# Patient Record
Sex: Female | Born: 1985 | Race: Black or African American | Hispanic: No | Marital: Single | Smoking: Never smoker
Health system: Southern US, Community
[De-identification: ages and names within clinical notes are randomized; demographics above are authoritative.]

## PROBLEM LIST (undated history)

## (undated) ENCOUNTER — Inpatient Hospital Stay (HOSPITAL_COMMUNITY): Payer: Self-pay

## (undated) DIAGNOSIS — IMO0002 Reserved for concepts with insufficient information to code with codable children: Secondary | ICD-10-CM

## (undated) DIAGNOSIS — D219 Benign neoplasm of connective and other soft tissue, unspecified: Secondary | ICD-10-CM

## (undated) HISTORY — DX: Reserved for concepts with insufficient information to code with codable children: IMO0002

## (undated) SURGERY — Surgical Case
Anesthesia: *Unknown

---
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
## 2005-09-06 ENCOUNTER — Inpatient Hospital Stay (HOSPITAL_COMMUNITY): Admission: AD | Admit: 2005-09-06 | Discharge: 2005-09-06 | Payer: Self-pay | Admitting: Obstetrics & Gynecology

## 2005-09-07 ENCOUNTER — Inpatient Hospital Stay (HOSPITAL_COMMUNITY): Admission: AD | Admit: 2005-09-07 | Discharge: 2005-09-07 | Payer: Self-pay | Admitting: Obstetrics & Gynecology

## 2005-09-08 ENCOUNTER — Inpatient Hospital Stay (HOSPITAL_COMMUNITY): Admission: AD | Admit: 2005-09-08 | Discharge: 2005-09-08 | Payer: Self-pay | Admitting: Obstetrics and Gynecology

## 2010-03-31 ENCOUNTER — Emergency Department (HOSPITAL_COMMUNITY)
Admission: EM | Admit: 2010-03-31 | Discharge: 2010-03-31 | Payer: Self-pay | Source: Home / Self Care | Admitting: Emergency Medicine

## 2010-09-23 ENCOUNTER — Inpatient Hospital Stay (INDEPENDENT_AMBULATORY_CARE_PROVIDER_SITE_OTHER)
Admission: RE | Admit: 2010-09-23 | Discharge: 2010-09-23 | Disposition: A | Discharge status: Home / Self Care | Payer: PRIVATE HEALTH INSURANCE | Source: Ambulatory Visit | Attending: Emergency Medicine | Admitting: Emergency Medicine

## 2010-09-23 DIAGNOSIS — J309 Allergic rhinitis, unspecified: Secondary | ICD-10-CM

## 2010-10-04 ENCOUNTER — Inpatient Hospital Stay (INDEPENDENT_AMBULATORY_CARE_PROVIDER_SITE_OTHER)
Admission: RE | Admit: 2010-10-04 | Discharge: 2010-10-04 | Disposition: A | Discharge status: Home / Self Care | Payer: PRIVATE HEALTH INSURANCE | Source: Ambulatory Visit | Attending: Emergency Medicine | Admitting: Emergency Medicine

## 2010-10-04 DIAGNOSIS — Z331 Pregnant state, incidental: Secondary | ICD-10-CM

## 2010-10-14 ENCOUNTER — Inpatient Hospital Stay (HOSPITAL_COMMUNITY)
Admission: EM | Admit: 2010-10-14 | Discharge: 2010-10-14 | Disposition: A | Discharge status: Home / Self Care | Payer: PRIVATE HEALTH INSURANCE | Source: Ambulatory Visit | Attending: Obstetrics & Gynecology | Admitting: Obstetrics & Gynecology

## 2010-10-14 ENCOUNTER — Inpatient Hospital Stay (HOSPITAL_COMMUNITY): Payer: PRIVATE HEALTH INSURANCE

## 2010-10-14 DIAGNOSIS — O209 Hemorrhage in early pregnancy, unspecified: Secondary | ICD-10-CM

## 2010-10-14 LAB — WET PREP, GENITAL: Trich, Wet Prep: NONE SEEN

## 2010-10-14 LAB — URINE MICROSCOPIC-ADD ON

## 2010-10-14 LAB — CBC
Hemoglobin: 11.9 g/dL — ABNORMAL LOW (ref 12.0–15.0)
MCHC: 32.5 g/dL (ref 30.0–36.0)
Platelets: 244 10*3/uL (ref 150–400)

## 2010-10-14 LAB — URINALYSIS, ROUTINE W REFLEX MICROSCOPIC
Bilirubin Urine: NEGATIVE
Glucose, UA: NEGATIVE mg/dL
Ketones, ur: NEGATIVE mg/dL
Nitrite: NEGATIVE
Protein, ur: NEGATIVE mg/dL
Urobilinogen, UA: 0.2 mg/dL (ref 0.0–1.0)

## 2010-10-14 LAB — HCG, QUANTITATIVE, PREGNANCY: hCG, Beta Chain, Quant, S: 5471 m[IU]/mL — ABNORMAL HIGH (ref <5)

## 2010-10-14 LAB — ABO/RH: ABO/RH(D): A POS

## 2010-10-16 ENCOUNTER — Inpatient Hospital Stay (HOSPITAL_COMMUNITY)
Admission: AD | Admit: 2010-10-16 | Discharge: 2010-10-16 | Disposition: A | Discharge status: Home / Self Care | Payer: PRIVATE HEALTH INSURANCE | Source: Ambulatory Visit | Attending: Obstetrics & Gynecology | Admitting: Obstetrics & Gynecology

## 2010-10-16 ENCOUNTER — Inpatient Hospital Stay (HOSPITAL_COMMUNITY): Payer: PRIVATE HEALTH INSURANCE

## 2010-10-16 DIAGNOSIS — O99891 Other specified diseases and conditions complicating pregnancy: Secondary | ICD-10-CM | POA: Insufficient documentation

## 2010-10-16 DIAGNOSIS — O9989 Other specified diseases and conditions complicating pregnancy, childbirth and the puerperium: Secondary | ICD-10-CM

## 2010-10-16 LAB — HCG, QUANTITATIVE, PREGNANCY: hCG, Beta Chain, Quant, S: 8944 m[IU]/mL — ABNORMAL HIGH (ref <5)

## 2010-10-17 ENCOUNTER — Other Ambulatory Visit: Payer: Self-pay | Admitting: Obstetrics & Gynecology

## 2010-10-17 DIAGNOSIS — O3680X0 Pregnancy with inconclusive fetal viability, not applicable or unspecified: Secondary | ICD-10-CM

## 2010-10-17 LAB — GC/CHLAMYDIA PROBE AMP, GENITAL: GC Probe Amp, Genital: NEGATIVE

## 2010-10-20 ENCOUNTER — Ambulatory Visit (HOSPITAL_COMMUNITY): Payer: PRIVATE HEALTH INSURANCE

## 2010-11-17 ENCOUNTER — Other Ambulatory Visit: Payer: Self-pay | Admitting: Obstetrics and Gynecology

## 2010-11-19 ENCOUNTER — Inpatient Hospital Stay (HOSPITAL_COMMUNITY)
Admission: AD | Admit: 2010-11-19 | Discharge: 2010-11-19 | Disposition: A | Discharge status: Home / Self Care | Payer: PRIVATE HEALTH INSURANCE | Source: Ambulatory Visit | Attending: Obstetrics and Gynecology | Admitting: Obstetrics and Gynecology

## 2010-11-19 DIAGNOSIS — Z79899 Other long term (current) drug therapy: Secondary | ICD-10-CM

## 2010-11-19 DIAGNOSIS — O039 Complete or unspecified spontaneous abortion without complication: Secondary | ICD-10-CM | POA: Insufficient documentation

## 2011-09-29 ENCOUNTER — Emergency Department (HOSPITAL_COMMUNITY)
Admission: EM | Admit: 2011-09-29 | Discharge: 2011-09-29 | Disposition: A | Discharge status: Home / Self Care | Payer: PRIVATE HEALTH INSURANCE | Attending: Emergency Medicine | Admitting: Emergency Medicine

## 2011-09-29 ENCOUNTER — Encounter (HOSPITAL_COMMUNITY): Payer: Self-pay | Admitting: Emergency Medicine

## 2011-09-29 ENCOUNTER — Emergency Department (HOSPITAL_COMMUNITY): Payer: PRIVATE HEALTH INSURANCE

## 2011-09-29 DIAGNOSIS — J45909 Unspecified asthma, uncomplicated: Secondary | ICD-10-CM | POA: Insufficient documentation

## 2011-09-29 DIAGNOSIS — W230XXA Caught, crushed, jammed, or pinched between moving objects, initial encounter: Secondary | ICD-10-CM | POA: Insufficient documentation

## 2011-09-29 DIAGNOSIS — S6000XA Contusion of unspecified finger without damage to nail, initial encounter: Secondary | ICD-10-CM

## 2011-09-29 DIAGNOSIS — S61219A Laceration without foreign body of unspecified finger without damage to nail, initial encounter: Secondary | ICD-10-CM

## 2011-09-29 DIAGNOSIS — IMO0002 Reserved for concepts with insufficient information to code with codable children: Secondary | ICD-10-CM | POA: Insufficient documentation

## 2011-09-29 DIAGNOSIS — S61209A Unspecified open wound of unspecified finger without damage to nail, initial encounter: Secondary | ICD-10-CM | POA: Insufficient documentation

## 2011-09-29 MED ORDER — HYDROCODONE-ACETAMINOPHEN 5-325 MG PO TABS: 1.0000 | ORAL_TABLET | ORAL | Status: AC | PRN

## 2011-09-29 MED ORDER — OXYCODONE-ACETAMINOPHEN 5-325 MG PO TABS
1.0000 | ORAL_TABLET | Freq: Once | ORAL | Status: AC
  Administered 2011-09-29: 1 via ORAL
  Filled 2011-09-29: qty 1

## 2011-09-29 MED ORDER — IBUPROFEN 800 MG PO TABS
800.0000 mg | ORAL_TABLET | Freq: Once | ORAL | Status: AC
  Administered 2011-09-29: 800 mg via ORAL
  Filled 2011-09-29: qty 1

## 2011-09-29 MED ORDER — TETANUS-DIPHTH-ACELL PERTUSSIS 5-2.5-18.5 LF-MCG/0.5 IM SUSP
0.5000 mL | Freq: Once | INTRAMUSCULAR | Status: AC
  Administered 2011-09-29: 0.5 mL via INTRAMUSCULAR
  Filled 2011-09-29: qty 0.5

## 2011-09-29 NOTE — Discharge Instructions (Signed)
Contusion A contusion is a deep bruise. Contusions happen when an injury causes bleeding under the skin. Signs of bruising include pain, puffiness (swelling), and discolored skin. The contusion may turn blue, purple, or yellow. HOME CARE   Put ice on the injured area.   Put ice in a plastic bag.   Place a towel between your skin and the bag.   Leave the ice on for 15 to 20 minutes, 3 to 4 times a day.   Only take medicine as told by your doctor.   Rest the injured area.   If possible, raise (elevate) the injured area to lessen puffiness.  GET HELP RIGHT AWAY IF:   You have more bruising or puffiness.   You have pain that is getting worse.   Your puffiness or pain is not helped by medicine.  MAKE SURE YOU:   Understand these instructions.   Will watch your condition.   Will get help right away if you are not doing well or get worse.  Document Released: 10/31/2007 Document Revised: 05/03/2011 Document Reviewed: 03/19/2011 [facility] Patient Information 2012 [place], [place]. The bruising and swelling is normal.  After an injury like yours as discussed please return if the finger turns numb discolored cold as also discussed keeping her hand elevated for the next 24 hours as much as possible and use ice to the palmar surface to help decrease swelling

## 2011-09-29 NOTE — ED Notes (Signed)
Patient states that she was seen here earlier today for finger laceration (middle right finger) and has sutures placed; patient states that her finger started turning blue earlier this afternoon, so she came back to have it evaluated.  Patient reports pain and numbness in finger; patient able to move all digits on right hand.  Two small blue/black areas noted to anterior joint areas of right middle finger.

## 2011-09-29 NOTE — ED Provider Notes (Signed)
History     CSN: [id number]  Arrival date & time 09/29/11  1942   First MD Initiated Contact with Patient 09/29/11 2007      Chief Complaint  Patient presents with  . Finger Injury    (Consider location/radiation/quality/duration/timing/severity/associated sxs/prior treatment) HPI Comments: Earlier today for finger laceration after being closed in a door.  She is now concerned because of the bruising  The history is provided by the patient.    Past Medical History  Diagnosis Date  . Asthma     History reviewed. No pertinent past surgical history.  History reviewed. No pertinent family history.  History  Substance Use Topics  . Smoking status: Never Smoker   . Smokeless tobacco: Not on file  . Alcohol Use: Yes     Social Use    OB History    Grav Para Term Preterm Abortions TAB SAB Ect Mult Living                  Review of Systems  Constitutional: Negative for fever.  Musculoskeletal: Positive for joint swelling.  Skin: Positive for wound.    Allergies  Review of patient's allergies indicates no known allergies.  Home Medications   Current Outpatient Rx  Name Route Sig Dispense Refill  . HYDROCODONE-ACETAMINOPHEN 5-325 MG PO TABS Oral Take 1 tablet by mouth every 4 (four) hours as needed for pain. 10 tablet 0  . NAPROXEN SODIUM 220 MG PO TABS Oral Take 440 mg by mouth as needed. For headache      BP 135/76  Pulse 96  Temp(Src) 98.1 F (36.7 C) (Oral)  Resp 20  SpO2 99%  LMP 09/05/2011  Physical Exam  Constitutional: She appears well-developed and well-nourished.  HENT:  Head: Normocephalic.  Neck: Normal range of motion.  Cardiovascular: Normal rate.   Pulmonary/Chest: Effort normal.  Musculoskeletal: Normal range of motion.  Neurological: She is alert.  Skin: Skin is warm.       Slight bruising and ecchymotic areas over the PIP knuckle of the right middle finger.  Full range of motion cap refill less than 3 seconds    ED Course    Procedures (including critical care time)  Labs Reviewed - No data to display Dg Finger Middle Right  09/29/2011  *RADIOLOGY REPORT*  Clinical Data: Closed right long finger in a door.  Laceration. Swelling.  RIGHT MIDDLE FINGER 2+V 09/29/2011:  Comparison: None.  Findings: Soft tissue injury dorsally overlying the head of the middle phalanx. No evidence of acute or subacute fracture or dislocation.  Well-preserved joint spaces.  Well-preserved bone mineral density.  No intrinsic osseous abnormalities.    No opaque foreign bodies in the soft tissues.  IMPRESSION: Soft tissue injury.  No osseous abnormality.  Original Report Authenticated By: Arnell Sieving, M.D.     1. Contusion of finger, right       MDM  Reassured patient that the bruising and swelling, was normal.  She feels reassured with this dressing was reapplied.  Patient was discharged him with the understanding that she should keep the finger elevated and iced for the next 24-48 hours        Arman Filter, NP 09/29/11 2033

## 2011-09-29 NOTE — ED Provider Notes (Signed)
Medical screening examination/treatment/procedure(s) were performed by non-physician practitioner and as supervising physician I was immediately available for consultation/collaboration.   Janeva Peaster R. Tashona Calk, MD 09/29/11 2338 

## 2011-09-29 NOTE — Discharge Instructions (Signed)
Please have your laceration be evaluated in approximately one week.  Please have it reevaluated sooner if you have redness, worsening swelling, fevers or pus drainage from the wound.  You may placed antibiotic ointment over the laceration and a dressing to cover it.  You may gently wash with soap and water but otherwise keep it dry.  Contusion A contusion is a deep bruise. Contusions are the result of an injury that caused bleeding under the skin. The contusion may turn blue, purple, or yellow. Minor injuries will give you a painless contusion, but more severe contusions may stay painful and swollen for a few weeks.  CAUSES  A contusion is usually caused by a blow, trauma, or direct force to an area of the body. SYMPTOMS   Swelling and redness of the injured area.   Bruising of the injured area.   Tenderness and soreness of the injured area.   Pain.  DIAGNOSIS  The diagnosis can be made by taking a history and physical exam. An X-ray, CT scan, or MRI may be needed to determine if there were any associated injuries, such as fractures. TREATMENT  Specific treatment will depend on what area of the body was injured. In general, the best treatment for a contusion is resting, icing, elevating, and applying cold compresses to the injured area. Over-the-counter medicines may also be recommended for pain control. Ask your caregiver what the best treatment is for your contusion. HOME CARE INSTRUCTIONS   Put ice on the injured area.   Put ice in a plastic bag.   Place a towel between your skin and the bag.   Leave the ice on for 15 to 20 minutes, 3 to 4 times a day.   Only take over-the-counter or prescription medicines for pain, discomfort, or fever as directed by your caregiver. Your caregiver may recommend avoiding anti-inflammatory medicines (aspirin, ibuprofen, and naproxen) for 48 hours because these medicines may increase bruising.   Rest the injured area.   If possible, elevate the  injured area to reduce swelling.  SEEK IMMEDIATE MEDICAL CARE IF:   You have increased bruising or swelling.   You have pain that is getting worse.   Your swelling or pain is not relieved with medicines.  MAKE SURE YOU:   Understand these instructions.   Will watch your condition.   Will get help right away if you are not doing well or get worse.  Document Released: 02/21/2005 Document Revised: 05/03/2011 Document Reviewed: 03/19/2011 [facility] Patient Information 2012 [place], [place].Laceration Care, Adult A laceration is a cut or lesion that goes through all layers of the skin and into the tissue just beneath the skin. TREATMENT  Some lacerations may not require closure. Some lacerations may not be able to be closed due to an increased risk of infection. It is important to see your caregiver as soon as possible after an injury to minimize the risk of infection and maximize the opportunity for successful closure. If closure is appropriate, pain medicines may be given, if needed. The wound will be cleaned to help prevent infection. Your caregiver will use stitches (sutures), staples, wound glue (adhesive), or skin adhesive strips to repair the laceration. These tools bring the skin edges together to allow for faster healing and a better cosmetic outcome. However, all wounds will heal with a scar. Once the wound has healed, scarring can be minimized by covering the wound with sunscreen during the day for 1 full year. HOME CARE INSTRUCTIONS  For sutures or staples:  Keep the wound clean and dry.   If you were given a bandage (dressing), you should change it at least once a day. Also, change the dressing if it becomes wet or dirty, or as directed by your caregiver.   Wash the wound with soap and water 2 times a day. Rinse the wound off with water to remove all soap. Pat the wound dry with a clean towel.   After cleaning, apply a thin layer of the antibiotic ointment as recommended by your  caregiver. This will help prevent infection and keep the dressing from sticking.   You may shower as usual after the first 24 hours. Do not soak the wound in water until the sutures are removed.   Only take over-the-counter or prescription medicines for pain, discomfort, or fever as directed by your caregiver.   Get your sutures or staples removed as directed by your caregiver.  For skin adhesive strips:  Keep the wound clean and dry.   Do not get the skin adhesive strips wet. You may bathe carefully, using caution to keep the wound dry.   If the wound gets wet, pat it dry with a clean towel.   Skin adhesive strips will fall off on their own. You may trim the strips as the wound heals. Do not remove skin adhesive strips that are still stuck to the wound. They will fall off in time.  For wound adhesive:  You may briefly wet your wound in the shower or bath. Do not soak or scrub the wound. Do not swim. Avoid periods of heavy perspiration until the skin adhesive has fallen off on its own. After showering or bathing, gently pat the wound dry with a clean towel.   Do not apply liquid medicine, cream medicine, or ointment medicine to your wound while the skin adhesive is in place. This may loosen the film before your wound is healed.   If a dressing is placed over the wound, be careful not to apply tape directly over the skin adhesive. This may cause the adhesive to be pulled off before the wound is healed.   Avoid prolonged exposure to sunlight or tanning lamps while the skin adhesive is in place. Exposure to ultraviolet light in the first year will darken the scar.   The skin adhesive will usually remain in place for 5 to 10 days, then naturally fall off the skin. Do not pick at the adhesive film.  You may need a tetanus shot if:  You cannot remember when you had your last tetanus shot.   You have never had a tetanus shot.  If you get a tetanus shot, your arm may swell, get red, and feel  warm to the touch. This is common and not a problem. If you need a tetanus shot and you choose not to have one, there is a rare chance of getting tetanus. Sickness from tetanus can be serious. SEEK MEDICAL CARE IF:   You have redness, swelling, or increasing pain in the wound.   You see a red line that goes away from the wound.   You have yellowish-white fluid (pus) coming from the wound.   You have a fever.   You notice a bad smell coming from the wound or dressing.   Your wound breaks open before or after sutures have been removed.   You notice something coming out of the wound such as wood or glass.   Your wound is on your hand or foot and you  cannot move a finger or toe.  SEEK IMMEDIATE MEDICAL CARE IF:   Your pain is not controlled with prescribed medicine.   You have severe swelling around the wound causing pain and numbness or a change in color in your arm, hand, leg, or foot.   Your wound splits open and starts bleeding.   You have worsening numbness, weakness, or loss of function of any joint around or beyond the wound.   You develop painful lumps near the wound or on the skin anywhere on your body.  MAKE SURE YOU:   Understand these instructions.   Will watch your condition.   Will get help right away if you are not doing well or get worse.  Document Released: 05/14/2005 Document Revised: 05/03/2011 Document Reviewed: 11/07/2010 [facility] Patient Information 2012 [place], [place].

## 2011-09-29 NOTE — ED Notes (Signed)
Pt states seen here for sutures for finger laceration. Pt went home and noticed that her finger was blue around her joint. Pt states she was concerned. Pt finger appears bruised. Pt able to wiggle finger and cap refill less than 3. Pt CNS intact distal to injury.

## 2011-09-29 NOTE — ED Provider Notes (Signed)
History   This chart was scribed for Shelly Christen, MD by Melba Coon. The patient was seen in room STRE4/STRE4 and the patient's care was started at 1:46PM.    CSN: [id number]  Arrival date & time 09/29/11  1309   First MD Initiated Contact with Patient 09/29/11 1345      Chief Complaint  Patient presents with  . Finger Injury    (Consider location/radiation/quality/duration/timing/severity/associated sxs/prior treatment) HPI Shelly Bradley is a 26 y.o. female who presents to the Emergency Department complaining of constant, moderate to severe radiating right middle finger pain with an onset an hour ago. Pt slammed her finger in the hosue door. Shooting pain is radiating from finger up the rt forearm. No HA, fever, neck pain, sore throat, rash, back pain, CP, SOB, abd pain, n/v/d, dysuria, or extremity weakness, numbness, or tingling. Hx of asthma. Tetanus shot is not up-to-date. No known allergies. No other pertinent medical symptoms. Pt is right-handed.  Past Medical History  Diagnosis Date  . Asthma     History reviewed. No pertinent past surgical history.  No family history on file.  History  Substance Use Topics  . Smoking status: Not on file  . Smokeless tobacco: Not on file  . Alcohol Use:     OB History    Grav Para Term Preterm Abortions TAB SAB Ect Mult Living                  Review of Systems 10 Systems reviewed and all are negative for acute change except as noted in the HPI.   Allergies  Review of patient's allergies indicates no known allergies.  Home Medications   Current Outpatient Rx  Name Route Sig Dispense Refill  . NAPROXEN SODIUM 220 MG PO TABS Oral Take 440 mg by mouth as needed. For headache      BP 95/48  Temp(Src) 98.6 F (37 C) (Oral)  Resp 18  SpO2 99%  LMP 09/05/2011  Physical Exam  Nursing note and vitals reviewed. Constitutional: She is oriented to person, place, and time. She appears well-developed and  well-nourished. No distress.  HENT:  Head: Normocephalic and atraumatic.  Eyes: EOM are normal. Pupils are equal, round, and reactive to light.  Neck: Normal range of motion. Neck supple. No tracheal deviation present.  Cardiovascular: Normal rate.   No murmur heard. Pulmonary/Chest: Effort normal. No respiratory distress.  Abdominal: Soft. There is no tenderness.  Musculoskeletal: Normal range of motion. She exhibits edema and tenderness.       Right 3rd digit: Pt can flex MCP and PIP; however DIP flexion is restricted; evulsion laceration present that extends from PIP along lateral aspect of nail; edema present; cap refill <2 sec  Neurological: She is alert and oriented to person, place, and time.  Skin: Skin is warm and dry.  Psychiatric: She has a normal mood and affect. Her behavior is normal.    ED Course  Procedures (including critical care time)  COORDINATION OF CARE:  1:49PM - EDMD will order PO pain meds, tetanus shot, and finger XR for the pt.  LACERATION REPAIR Performed by:  Consent: Verbal consent obtained. Risks and benefits: risks, benefits and alternatives were discussed Patient identity confirmed: provided demographic data Time out performed prior to procedure Prepped and Draped in normal sterile fashion Wound explored  Laceration Location: right 3rd digit  Laceration Length: 4 cm  No Foreign Bodies seen or palpated  Anesthesia: digital block of R middle digit  Local anesthetic:  lidocaine 2% no epinephrine  Anesthetic total: 5 ml  Irrigation method: syringe Amount of cleaning: standard  Skin closure: Chromic 4-0 P-3  Number of sutures or staples: 6 sutures  Technique: simple interrupted stitching   Patient tolerance: Patient tolerated the procedure well with no immediate complications.    Labs Reviewed - No data to display Dg Finger Middle Right  09/29/2011  *RADIOLOGY REPORT*  Clinical Data: Closed right long finger in a door.  Laceration.  Swelling.  RIGHT MIDDLE FINGER 2+V 09/29/2011:  Comparison: None.  Findings: Soft tissue injury dorsally overlying the head of the middle phalanx. No evidence of acute or subacute fracture or dislocation.  Well-preserved joint spaces.  Well-preserved bone mineral density.  No intrinsic osseous abnormalities.    No opaque foreign bodies in the soft tissues.  IMPRESSION: Soft tissue injury.  No osseous abnormality.  Original Report Authenticated By: Arnell Sieving, M.D.     No diagnosis found.    MDM  Patient with no fracture on her x-ray.  She does have significant laceration which was repaired here.  She was given precautions regarding returning for redness, pus drainage, fevers or increased swelling or pain.  She understands this at time of discharge.  She was given wound care instructions as well.   I personally performed the services described in this documentation, which was scribed in my presence. The recorded information has been reviewed and considered.      Shelly Christen, MD 09/29/11 1455

## 2011-09-29 NOTE — ED Notes (Signed)
Patient advises that she had her right middle finger finger closed in a house door. Laceration noted to the right middle finger approximately 2 inches with minimal bleeding.

## 2011-09-29 NOTE — ED Notes (Signed)
Wound cleaned, Bacitracin and bandaged applied to laceration.

## 2012-04-10 ENCOUNTER — Emergency Department (INDEPENDENT_AMBULATORY_CARE_PROVIDER_SITE_OTHER)
Admission: EM | Admit: 2012-04-10 | Discharge: 2012-04-10 | Disposition: A | Discharge status: Home / Self Care | Payer: BC Managed Care – PPO | Source: Home / Self Care | Attending: Emergency Medicine | Admitting: Emergency Medicine

## 2012-04-10 ENCOUNTER — Encounter (HOSPITAL_COMMUNITY): Payer: Self-pay | Admitting: *Deleted

## 2012-04-10 DIAGNOSIS — N72 Inflammatory disease of cervix uteri: Secondary | ICD-10-CM

## 2012-04-10 LAB — POCT PREGNANCY, URINE: Preg Test, Ur: NEGATIVE

## 2012-04-10 LAB — POCT URINALYSIS DIP (DEVICE)
Glucose, UA: NEGATIVE mg/dL
Ketones, ur: 15 mg/dL — AB
Protein, ur: NEGATIVE mg/dL
Specific Gravity, Urine: 1.025 (ref 1.005–1.030)
Urobilinogen, UA: 0.2 mg/dL (ref 0.0–1.0)

## 2012-04-10 LAB — GLUCOSE, CAPILLARY: Glucose-Capillary: 74 mg/dL (ref 70–99)

## 2012-04-10 LAB — WET PREP, GENITAL

## 2012-04-10 MED ORDER — ONDANSETRON 8 MG PO TBDP: 8.0000 mg | ORAL_TABLET | Freq: Three times a day (TID) | ORAL | Status: DC | PRN

## 2012-04-10 MED ORDER — CEFTRIAXONE SODIUM 250 MG IJ SOLR
250.0000 mg | Freq: Once | INTRAMUSCULAR | Status: AC
  Administered 2012-04-10: 250 mg via INTRAMUSCULAR

## 2012-04-10 MED ORDER — DOXYCYCLINE HYCLATE 100 MG PO TABS: 100.0000 mg | ORAL_TABLET | Freq: Two times a day (BID) | ORAL | Status: DC

## 2012-04-10 MED ORDER — AZITHROMYCIN 250 MG PO TABS
ORAL_TABLET | ORAL | Status: AC
  Filled 2012-04-10: qty 4

## 2012-04-10 MED ORDER — AZITHROMYCIN 250 MG PO TABS
1000.0000 mg | ORAL_TABLET | Freq: Once | ORAL | Status: AC
  Administered 2012-04-10: 1000 mg via ORAL

## 2012-04-10 MED ORDER — ONDANSETRON 4 MG PO TBDP
8.0000 mg | ORAL_TABLET | Freq: Once | ORAL | Status: AC
  Administered 2012-04-10: 8 mg via ORAL

## 2012-04-10 MED ORDER — ONDANSETRON 4 MG PO TBDP
ORAL_TABLET | ORAL | Status: AC
  Filled 2012-04-10: qty 2

## 2012-04-10 MED ORDER — CEFTRIAXONE SODIUM 1 G IJ SOLR
INTRAMUSCULAR | Status: AC
  Filled 2012-04-10: qty 10

## 2012-04-10 MED ORDER — LIDOCAINE HCL (PF) 1 % IJ SOLN
INTRAMUSCULAR | Status: AC
  Filled 2012-04-10: qty 5

## 2012-04-10 NOTE — ED Notes (Signed)
Pt reports lower abdomen cramping for over a year, recurring yeast infections that are temporarily relieved by otc meds, states that she does not feel like her bladder is completely emptying after urination.

## 2012-04-10 NOTE — ED Provider Notes (Addendum)
Chief Complaint  Patient presents with  . Abdominal Pain  . Vaginitis    History of Present Illness:   Shelly Bradley is a 26 year old female who has had a one-year history of recurring vaginal discharge. She has attributed this to a yeast infection, although has never seen a physician for it and has tried multiple courses of Monistat without permanent relief. The current discharge is been going on for about 2 months. She denies any itching either internally or externally but does feel slightly irritated. She denies any odor. The past one week she's had mild lower abdominal pain which comes and goes. At the worst is only a 4/10 in intensity. It's worse if she sneezes and nothing particularly makes it better. She denies any fever, chills, nausea, or vomiting. She has had some urinary frequency but denies dysuria, urgency, or hematuria. Her last menstrual period was one week ago. Menses have been regular. She has not had any sexual activity in 5 months.  Review of Systems:  Other than noted above, the patient denies any of the following symptoms: Systemic:  No fever, chills, sweats, fatigue, or weight loss. GI:  No abdominal pain, nausea, anorexia, vomiting, diarrhea, constipation, melena or hematochezia. GU:  No dysuria, frequency, urgency, hematuria, vaginal discharge, itching, or abnormal vaginal bleeding. Skin:  No rash or itching.  PMFSH:  Past medical history, family history, social history, meds, and allergies were reviewed.  Physical Exam:   Vital signs:  BP 133/86  Pulse 77  Temp 99 F (37.2 C) (Oral)  Resp 16  SpO2 100%  LMP 04/02/2012 General:  Alert, oriented and in no distress. Lungs:  Breath sounds clear and equal bilaterally.  No wheezes, rales or rhonchi. Heart:  Regular rhythm.  No gallops or murmers. Abdomen:  Soft, flat and non-distended.  No organomegaly or mass.  No tenderness, guarding or rebound.  Bowel sounds normally active. Pelvic exam:  Normal external genitalia, vaginal  mucosa was normal, there was some cervical eversion any motion and the cervix bled after the culture was obtained. She had some mucoid discharge coming from the cervical os, but little other discharge. There is mild tenderness on cervical motion, mild uterine tenderness but no uterine enlargement, and mild bilateral adnexal tenderness but no mass. Skin:  Clear, warm and dry.  Labs:   Results for orders placed during the hospital encounter of 04/10/12  POCT PREGNANCY, URINE      Component Value Range   Preg Test, Ur NEGATIVE  NEGATIVE  WET PREP, GENITAL      Component Value Range   Yeast Wet Prep HPF POC NONE SEEN  NONE SEEN   Trich, Wet Prep NONE SEEN  NONE SEEN   Clue Cells Wet Prep HPF POC NONE SEEN  NONE SEEN   WBC, Wet Prep HPF POC MANY (*) NONE SEEN  GLUCOSE, CAPILLARY      Component Value Range   Glucose-Capillary 74  70 - 99 mg/dL  POCT URINALYSIS DIP (DEVICE)      Component Value Range   Glucose, UA NEGATIVE  NEGATIVE mg/dL   Bilirubin Urine NEGATIVE  NEGATIVE   Ketones, ur 15 (*) NEGATIVE mg/dL   Specific Gravity, Urine 1.025  1.005 - 1.030   Hgb urine dipstick NEGATIVE  NEGATIVE   pH 5.5  5.0 - 8.0   Protein, ur NEGATIVE  NEGATIVE mg/dL   Urobilinogen, UA 0.2  0.0 - 1.0 mg/dL   Nitrite NEGATIVE  NEGATIVE   Leukocytes, UA MODERATE (*) NEGATIVE  Course in Urgent Care Center:   She was given Rocephin 250 mg IM and azithromycin 1000 mg by mouth. The erythromycin made her a little bit nauseated, so she was given Zofran ODT 8 mg sublingually. This did help some with nausea and she did not vomit the azithromycin back up. She recalls that she had a similar reaction to azithromycin in the past.  Assessment:  The encounter diagnosis was Cervicitis.  Clinically she has cervicitis and not vaginitis. She may also have a mild PID as well.  Plan:   1.  The following meds were prescribed:   New Prescriptions   DOXYCYCLINE (VIBRA-TABS) 100 MG TABLET    Take 1 tablet (100 mg total)  by mouth 2 (two) times daily.   ONDANSETRON (ZOFRAN ODT) 8 MG DISINTEGRATING TABLET    Take 1 tablet (8 mg total) by mouth every 8 (eight) hours as needed for nausea.   2.  The patient was instructed in symptomatic care and handouts were given. 3.  The patient was told to return if becoming worse in any way, if no better in 3 or 4 days, and given some red flag symptoms that would indicate earlier return.  Follow up:  The patient was told to follow up with Dr. Shawnie Pons the gynecologist on-call today in 2 weeks.     Reuben Likes, MD 04/10/12 1954  Reuben Likes, MD 04/10/12 [phone]

## 2012-04-11 NOTE — ED Notes (Signed)
Report of GC/chlamydia pending Final report of wet prep shows many WBC, no clue, no trichomonas or yeast Treated in clinic w 1 gram Azithromycin, 250 rocephin IM, provided w Rx for doxycyline 100, BID x20 tablets; other treatment to be determined after labs final reports available

## 2012-04-14 ENCOUNTER — Emergency Department (HOSPITAL_COMMUNITY)
Admission: EM | Admit: 2012-04-14 | Discharge: 2012-04-14 | Disposition: A | Discharge status: Home / Self Care | Payer: BC Managed Care – PPO | Attending: Emergency Medicine | Admitting: Emergency Medicine

## 2012-04-14 ENCOUNTER — Emergency Department (HOSPITAL_COMMUNITY): Payer: BC Managed Care – PPO

## 2012-04-14 ENCOUNTER — Encounter (HOSPITAL_COMMUNITY): Payer: Self-pay | Admitting: Emergency Medicine

## 2012-04-14 DIAGNOSIS — R102 Pelvic and perineal pain: Secondary | ICD-10-CM

## 2012-04-14 DIAGNOSIS — N949 Unspecified condition associated with female genital organs and menstrual cycle: Secondary | ICD-10-CM | POA: Insufficient documentation

## 2012-04-14 DIAGNOSIS — D259 Leiomyoma of uterus, unspecified: Secondary | ICD-10-CM | POA: Insufficient documentation

## 2012-04-14 DIAGNOSIS — J45909 Unspecified asthma, uncomplicated: Secondary | ICD-10-CM | POA: Insufficient documentation

## 2012-04-14 DIAGNOSIS — Z79899 Other long term (current) drug therapy: Secondary | ICD-10-CM | POA: Insufficient documentation

## 2012-04-14 DIAGNOSIS — N898 Other specified noninflammatory disorders of vagina: Secondary | ICD-10-CM | POA: Insufficient documentation

## 2012-04-14 LAB — URINALYSIS, ROUTINE W REFLEX MICROSCOPIC
Glucose, UA: NEGATIVE mg/dL
Ketones, ur: NEGATIVE mg/dL
Leukocytes, UA: NEGATIVE
Nitrite: NEGATIVE
Protein, ur: NEGATIVE mg/dL
pH: 7.5 (ref 5.0–8.0)

## 2012-04-14 LAB — URINE MICROSCOPIC-ADD ON

## 2012-04-14 LAB — WET PREP, GENITAL: Yeast Wet Prep HPF POC: NONE SEEN

## 2012-04-14 MED ORDER — METRONIDAZOLE 500 MG PO TABS: 500.0000 mg | ORAL_TABLET | Freq: Two times a day (BID) | ORAL | Status: DC

## 2012-04-14 MED ORDER — FLUCONAZOLE 150 MG PO TABS: 150.0000 mg | ORAL_TABLET | Freq: Once | ORAL | Status: DC

## 2012-04-14 NOTE — ED Provider Notes (Signed)
History     CSN: [id number]  Arrival date & time 04/14/12  1012   First MD Initiated Contact with Patient 04/14/12 1223      Chief Complaint  Patient presents with  . Pelvic Pain    (Consider location/radiation/quality/duration/timing/severity/associated sxs/prior treatment) HPI Comments: Shelly Bradley 26 y.o. female   The chief complaint is: Patient presents with:   Pelvic Pain    Patient c/o pelvic pain and vaginal dc.  Seen and [facility] treated with Rocephin azithromycin. Patient unable to hold the azithro down. Patient also given doxy, unable to tolerate due to vomiting as well. Patient states that she was afraid that she ws being undertreated as her sxs have not resolved and may be slightly worse.  Patient was in 3 year monogamous relationship until 5 mos ago. She has not had intercourse since that time.   Neg. GC/Clamydia and wet prep shows only WBCs. No IUD.  Denies fevers, chills, myalgias, arthralgias. Denies DOE, SOB, chest tightness or pressure, radiation to left arm, jaw or back, or diaphoresis. Denies dysuria, flank pain, suprapubic pain, frequency, urgency, or hematuria. Denies headaches, light headedness, weakness, visual disturbances. Denies abdominal pain, nausea, vomiting, diarrhea or constipation.       The history is provided by the patient. No language interpreter was used.    Past Medical History  Diagnosis Date  . Asthma     No past surgical history on file.  No family history on file.  History  Substance Use Topics  . Smoking status: Never Smoker   . Smokeless tobacco: Not on file  . Alcohol Use: Yes     Comment: Social Use    OB History    Grav Para Term Preterm Abortions TAB SAB Ect Mult Living   1 0   1  1         Review of Systems  Constitutional: Negative.   HENT: Negative.   Eyes: Negative.   Respiratory: Negative.   Cardiovascular: Negative.   Gastrointestinal: Negative.   Genitourinary: Positive for vaginal discharge and  pelvic pain. Negative for dysuria, frequency, flank pain and vaginal bleeding.  Musculoskeletal: Negative.   Neurological: Negative.   Psychiatric/Behavioral: Negative.   All other systems reviewed and are negative.    Allergies  Review of patient's allergies indicates no known allergies.  Home Medications   Current Outpatient Rx  Name  Route  Sig  Dispense  Refill  . DOXYCYCLINE HYCLATE 100 MG PO TABS   Oral   Take 100 mg by mouth 2 (two) times daily. 10 day course; started on 11-15.  Currently making patient throw up with each dose.         Marland Kitchen NAPROXEN SODIUM 220 MG PO TABS   Oral   Take 440 mg by mouth as needed. For headache         . ONDANSETRON 8 MG PO TBDP   Oral   Take 8 mg by mouth every 8 (eight) hours as needed. For nausea         . FLUCONAZOLE 150 MG PO TABS   Oral   Take 1 tablet (150 mg total) by mouth once.   1 tablet   0   . METRONIDAZOLE 500 MG PO TABS   Oral   Take 1 tablet (500 mg total) by mouth 2 (two) times daily. One po bid x 7 days   14 tablet   0     BP 110/76  Pulse 71  Temp 98.5 F (36.9  C) (Oral)  Resp 20  SpO2 100%  LMP 04/02/2012  Physical Exam  Constitutional: She is oriented to person, place, and time. She appears well-developed and well-nourished. No distress.  HENT:  Head: Normocephalic and atraumatic.  Eyes: Conjunctivae normal are normal. No scleral icterus.  Neck: Normal range of motion.  Cardiovascular: Normal rate, regular rhythm and normal heart sounds.  Exam reveals no gallop and no friction rub.   No murmur heard. Pulmonary/Chest: Effort normal and breath sounds normal. No respiratory distress.  Abdominal: Soft. Bowel sounds are normal. She exhibits no distension and no mass. There is no tenderness. There is no guarding.  Genitourinary:       Pelvic exam: VULVA: normal appearing vulva with no masses, tenderness or lesions, VAGINA: normal appearing vagina with normal color and discharge, no lesions, vaginal  discharge - curd-like, CERVIX: normal appearing cervix without discharge or lesions, Positive CMT ADNEXA: tenderness both sides, WET MOUNT done - results: white blood cells, exam chaperoned by [name].   Neurological: She is alert and oriented to person, place, and time.  Skin: Skin is warm and dry. She is not diaphoretic.    ED Course  Procedures (including critical care time)  Labs Reviewed  URINALYSIS, ROUTINE W REFLEX MICROSCOPIC - Abnormal; Notable for the following:    APPearance HAZY (*)     Hgb urine dipstick SMALL (*)     All other components within normal limits  WET PREP, GENITAL - Abnormal; Notable for the following:    WBC, Wet Prep HPF POC MANY (*)     All other components within normal limits  URINE MICROSCOPIC-ADD ON - Abnormal; Notable for the following:    Squamous Epithelial / LPF FEW (*)     All other components within normal limits   US Transvaginal Non-ob  04/14/2012  *RADIOLOGY REPORT*  Clinical Data: Pelvic pain and vaginal discharge.  TRANSABDOMINAL AND TRANSVAGINAL ULTRASOUND OF PELVIS Technique:  Both transabdominal and transvaginal ultrasound examinations of the pelvis were performed. Transabdominal technique was performed for global imaging of the pelvis including uterus, ovaries, adnexal regions, and pelvic cul-de-sac.  It was necessary to proceed with endovaginal exam following the transabdominal exam to visualize the ovaries and endometrium.  Comparison:  None  Findings:  Uterus: Measures 7.2 x 3.6 x 4.9 cm.  Posterior myometrial fibroids are noted.  The largest fibroid measures 2.6 x 2.4 x 2.3 cm. Nabothian cysts are noted.  Endometrium: Normal in thickness measuring a maximum of 9 mm.  Right ovary:  Measures 3.3 x 2.9 x 3.2 cm.  A 2.2 x 1.2 x 1.2 collapsing cyst is noted.  Left ovary: Measures 3.8 x 1.6 x 1.6 cm.  No cysts or masses.  Other findings: Trace free pelvic fluid.  IMPRESSION:  1.  Small uterine fibroids. 2.  Normal endometrium. 3.  Collapsing right  ovarian cyst. 4.  Trace free pelvic fluid.   Original Report Authenticated By: Rudie Meyer, M.D.    US Pelvis Complete  04/14/2012  *RADIOLOGY REPORT*  Clinical Data: Pelvic pain and vaginal discharge.  TRANSABDOMINAL AND TRANSVAGINAL ULTRASOUND OF PELVIS Technique:  Both transabdominal and transvaginal ultrasound examinations of the pelvis were performed. Transabdominal technique was performed for global imaging of the pelvis including uterus, ovaries, adnexal regions, and pelvic cul-de-sac.  It was necessary to proceed with endovaginal exam following the transabdominal exam to visualize the ovaries and endometrium.  Comparison:  None  Findings:  Uterus: Measures 7.2 x 3.6 x 4.9 cm.  Posterior myometrial fibroids  are noted.  The largest fibroid measures 2.6 x 2.4 x 2.3 cm. Nabothian cysts are noted.  Endometrium: Normal in thickness measuring a maximum of 9 mm.  Right ovary:  Measures 3.3 x 2.9 x 3.2 cm.  A 2.2 x 1.2 x 1.2 collapsing cyst is noted.  Left ovary: Measures 3.8 x 1.6 x 1.6 cm.  No cysts or masses.  Other findings: Trace free pelvic fluid.  IMPRESSION:  1.  Small uterine fibroids. 2.  Normal endometrium. 3.  Collapsing right ovarian cyst. 4.  Trace free pelvic fluid.   Original Report Authenticated By: Rudie Meyer, M.D.      1. Pelvic pain in female   2. Vaginal discharge   3. Uterine fibroid       MDM  Patient with neg. Gc/ chlamydia and wet prep.   I do not feel that patient is at great risk for chlamydia, but PE concerning for PID. [place] negative for acute findings.  I have spoken with Dr, Burnice Logan  At [facility] who suggests d/c patient with diflucan and metronidazole. I have messaged Dr. Burnice Logan with Patient contact info; she will contact the patient with close follow up appointment at [facility] outpatient clinic. Discussed reasons to seek immediate care. Patient expresses understanding and agrees with plan.          Arthor Captain, PA-C 04/15/12 [phone]

## 2012-04-14 NOTE — ED Notes (Signed)
States was given meds for vaginosis and she has gotten sick taking it both ones  That she was given at [facility] and rx ones .  Last time she took it was this am states has vomited and diarrhea

## 2012-04-15 NOTE — ED Provider Notes (Signed)
Medical screening examination/treatment/procedure(s) were performed by non-physician practitioner and as supervising physician I was immediately available for consultation/collaboration.  Derwood Kaplan, MD 04/15/12 [phone]

## 2012-05-05 ENCOUNTER — Encounter: Payer: Self-pay | Admitting: Obstetrics and Gynecology

## 2012-05-05 ENCOUNTER — Ambulatory Visit (INDEPENDENT_AMBULATORY_CARE_PROVIDER_SITE_OTHER): Payer: BC Managed Care – PPO | Admitting: Obstetrics and Gynecology

## 2012-05-05 VITALS — BP 126/89 | HR 69 | Temp 97.0°F | Ht 62.0 in | Wt 149.8 lb

## 2012-05-05 DIAGNOSIS — N898 Other specified noninflammatory disorders of vagina: Secondary | ICD-10-CM

## 2012-05-05 DIAGNOSIS — D259 Leiomyoma of uterus, unspecified: Secondary | ICD-10-CM

## 2012-05-05 DIAGNOSIS — R102 Pelvic and perineal pain unspecified side: Secondary | ICD-10-CM

## 2012-05-05 DIAGNOSIS — D219 Benign neoplasm of connective and other soft tissue, unspecified: Secondary | ICD-10-CM

## 2012-05-05 DIAGNOSIS — N949 Unspecified condition associated with female genital organs and menstrual cycle: Secondary | ICD-10-CM

## 2012-05-05 MED ORDER — IBUPROFEN 800 MG PO TABS: 800.0000 mg | ORAL_TABLET | Freq: Three times a day (TID) | ORAL | Status: DC | PRN

## 2012-05-05 NOTE — Patient Instructions (Signed)
Uterine Fibroid A uterine fibroid is a growth (tumor) that occurs in a woman's uterus. This type of tumor is not cancerous and does not spread out of the uterus. A woman can have one or many fibroids, and the fiboid(s) can become quite large. A fibroid can vary in size, weight, and where it grows in the uterus. Most fibroids do not require medical treatment, but some can cause pain or heavy bleeding during and between periods. CAUSES  A fibroid is the result of a single uterine cell that keeps growing (unregulated), which is different than most cells in the human body. Most cells have a control mechanism that keeps them from reproducing without control.  SYMPTOMS   Bleeding.  Pelvic pain and pressure.  Bladder problems due to the size of the fibroid.  Infertility and miscarriages depending on the size and location of the fibroid. DIAGNOSIS  A diagnosis is made by physical exam. Your caregiver may feel the lumpy tumors during a pelvic exam. Important information regarding size, location, and number of tumors can be gained by having an ultrasound. It is rare that other tests, such as a CT scan or MRI, are needed. TREATMENT   Your caregiver may recommend watchful waiting. This involves getting the fibroid checked by your caregiver to see if the fibroids grow or shrink.   Hormonal treatment or an intrauterine device (IUD) may be prescribed.   Surgery may be needed to remove the fibroids (myomectomy) or the uterus (hysterectomy). This depends on your situation. When fibroids interfere with fertility and a woman wants to become pregnant, a caregiver may recommend having the fibroids removed.  HOME CARE INSTRUCTIONS  Home care depends on how you were treated. In general:   Keep all follow-up appointments with your caregiver.   Only take medicine as told by your caregiver. Do not take aspirin. It can cause bleeding.   If you have excessive periods and soak tampons or pads in a half hour or  less, contact your caregiver immediately. If your periods are troublesome but not so heavy, lie down with your feet raised slightly above your heart. Place cold packs on your lower abdomen.   If your periods are heavy, write down the number of pads or tampons you use per month. Bring this information to your caregiver.   Talk to your caregiver about taking iron pills.   Include green vegetables in your diet.   If you were prescribed a hormonal treatment, take the hormonal medicines as directed.   If you need surgery, ask your caregiver for information on your specific surgery.  SEEK IMMEDIATE MEDICAL CARE IF:  You have pelvic pain or cramps not controlled with medicines.   You have a sudden increase in pelvic pain.   You have an increase of bleeding between and during periods.   You feel lightheaded or have fainting episodes.  MAKE SURE YOU:  Understand these instructions.  Will watch your condition.  Will get help right away if you are not doing well or get worse. Document Released: 05/11/2000 Document Revised: 08/06/2011 Document Reviewed: 06/04/2011 [facility] Patient Information 2013 [place], [place]. Pelvic Pain Pelvic pain is pain below the belly button and located between your hips. Acute pain may last a few hours or days. Chronic pelvic pain may last weeks and months. The cause may be different for different types of pain. The pain may be dull or sharp, mild or severe and can interfere with your daily activities. Write down and tell your caregiver:  Exactly where the pain is located.  If it comes and goes or is there all the time.  When it happens (with sex, urination, bowel movement, etc.)  If the pain is related to your menstrual period or stress. Your caregiver will take a full history and do a complete physical exam and Pap test. CAUSES   Painful menstrual periods (dysmenorrhea).  Normal ovulation (Mittelschmertz) that occurs in the middle of the  menstrual cycle every month.  The pelvic organs get engorged with blood just before the menstrual period (pelvic congestive syndrome).  Scar tissue from an infection or past surgery (pelvic adhesions).  Cancer of the female pelvic organs. When there is pain with cancer, it has been there for a long time.  The lining of the uterus (endometrium) abnormally grows in places like the pelvis and on the pelvic organs (endometriosis).  A form of endometriosis with the lining of the uterus present inside of the muscle tissue of the uterus (adenomyosis).  Fibroid tumor (noncancerous) in the uterus.  Bladder problems such as infection, bladder spasms of the muscle tissue of the bladder.  Intestinal problems (irritable bowel syndrome, colitis, an ulcer or gastrointestinal infection).  Polyps of the cervix or uterus.  Pregnancy in the tube (ectopic pregnancy).  The opening of the cervix is too small for the menstrual blood to flow through it (cervical stenosis).  Physical or sexual abuse (past or present).  Musculo-skeletal problems from poor posture, problems with the vertebrae of the lower back or the uterine pelvic muscles falling (prolapse).  Psychological problems such as depression or stress.  IUD (intrauterine device) in the uterus. DIAGNOSIS  Tests to make a diagnosis depends on the type, location, severity and what causes the pain to occur. Tests that may be needed include:  Blood tests.  Urine tests  Ultrasound.  X-rays.  CT Scan.  MRI.  Laparoscopy.  Major surgery. TREATMENT  Treatment will depend on the cause of the pain, which includes:  Prescription or over-the-counter pain medication.  Antibiotics.  Birth control pills.  Hormone treatment.  Nerve blocking injections.  Physical therapy.  Antidepressants.  Counseling with a psychiatrist or psychologist.  Minor or major surgery. HOME CARE INSTRUCTIONS   Only take over-the-counter or prescription  medicines for pain, discomfort or fever as directed by your caregiver.  Follow your caregiver's advice to treat your pain.  Rest.  Avoid sexual intercourse if it causes the pain.  Apply warm or cold compresses (which ever works best) to the pain area.  Do relaxation exercises such as yoga or meditation.  Try acupuncture.  Avoid stressful situations.  Try group therapy.  If the pain is because of a stomach/intestinal upset, drink clear liquids, eat a bland light food diet until the symptoms go away. SEEK MEDICAL CARE IF:   You need stronger prescription pain medication.  You develop pain with sexual intercourse.  You have pain with urination.  You develop a temperature of 102 F (38.9 C) with the pain.  You are still in pain after 4 hours of taking prescription medication for the pain.  You need depression medication.  Your IUD is causing pain and you want it removed. SEEK IMMEDIATE MEDICAL CARE IF:  You develop very severe pain or tenderness.  You faint, have chills, severe weakness or dehydration.  You develop heavy vaginal bleeding or passing solid tissue.  You develop a temperature of 102 F (38.9 C) with the pain.  You have blood in the urine.  You are being  physically or sexually abused.  You have uncontrolled vomiting and diarrhea.  You are depressed and afraid of harming yourself or someone else. Document Released: 06/21/2004 Document Revised: 08/06/2011 Document Reviewed: 03/18/2008 [facility] Patient Information 2013 [place], [place].

## 2012-05-05 NOTE — Progress Notes (Addendum)
CC: Follow-up and Pelvic Pain    HPI Shelly Bradley is a 26 y.o. G2P0020 who is referred from [facility] for pelvic pain. She was worked up initially at [facility] 04/10/12. Pelvic US remarkable for small posterior fibroids and small collapsing left ovarian cyst with trace free fluid. Wet prep negative except WBCs. GC/ chlamydia were negative. She was presumptively treated with Rocephin and Zithromax. She states that Zithromax cause her to vomit and she was afraid she was incompletely treated so presented at [facility] 04/14/2012 and got doxycycline. She describes several month history of lower abdominal crampy pain that is constant except when she is on her menstrual period. Pain waxes and wanes. Is typically worse about one week before menstruation. She takes Aleve with some benefit. She has dyspareunia and has not been sexually active for 6 months. She denies hematuria, frequency or urgency of urination, dysuria, nocturia. Denies constipation or diarrhea. Pain is not affect did by bowel movement or urination.  LMP 04/26/12. Denies abnormal bleeding. States last 3 Pap smears were normal that she has had abnormal in the past. Gets Pap at [facility].  Past Medical History  Diagnosis Date  . Asthma   . Abnormal pap 2004    OB History    Grav Para Term Preterm Abortions TAB SAB Ect Mult Living   2 0 0 0 2 0 2 0 0 0      # Outc Date GA Lbr Len/2nd Wgt Sex Del Anes PTL Lv   1 SAB            2 SAB               Past Surgical History  Procedure Date  . Cervical biopsy  w/ loop electrode excision 2004    History   Social History  . Marital Status: Single    Spouse Name: N/A    Number of Children: N/A  . Years of Education: N/A   Occupational History  . Not on file.   Social History Main Topics  . Smoking status: Never Smoker   . Smokeless tobacco: Never Used  . Alcohol Use: Yes     Comment: Social Use  . Drug Use: No  . Sexually Active: Not Currently   Other Topics Concern  . Not on file   Social  History Narrative  . No narrative on file    Current Outpatient Prescriptions on File Prior to Visit  Medication Sig Dispense Refill  . fexofenadine (ALLEGRA) 30 MG tablet Take 30 mg by mouth once.        No Known Allergies  ROS Pertinent items in HPI  PHYSICAL EXAM Filed Vitals:   05/05/12 1316  BP: 126/89  Pulse: 69  Temp: 97 F (36.1 C)   General: Well nourished, well developed female in no acute distress Cardiovascular: Normal rate Respiratory: Normal effort Abdomen: Soft, nontender Back: No CVAT Extremities: No edema Neurologic: Alert and oriented Speculum exam: NEFG; vagina with physiologic discharge, no blood; cervix clean Bimanual exam: cervix closed, no CMT; uterus NSSP; no adnexal tenderness or masses  LAB RESULTS No results found for this or any previous visit (from the past 24 hour(s)).  IMAGING US Transvaginal Non-ob  04/14/2012  *RADIOLOGY REPORT*  Clinical Data: Pelvic pain and vaginal discharge.  TRANSABDOMINAL AND TRANSVAGINAL ULTRASOUND OF PELVIS Technique:  Both transabdominal and transvaginal ultrasound examinations of the pelvis were performed. Transabdominal technique was performed for global imaging of the pelvis including uterus, ovaries, adnexal regions, and pelvic cul-de-sac.  It  was necessary to proceed with endovaginal exam following the transabdominal exam to visualize the ovaries and endometrium.  Comparison:  None  Findings:  Uterus: Measures 7.2 x 3.6 x 4.9 cm.  Posterior myometrial fibroids are noted.  The largest fibroid measures 2.6 x 2.4 x 2.3 cm. Nabothian cysts are noted.  Endometrium: Normal in thickness measuring a maximum of 9 mm.  Right ovary:  Measures 3.3 x 2.9 x 3.2 cm.  A 2.2 x 1.2 x 1.2 collapsing cyst is noted.  Left ovary: Measures 3.8 x 1.6 x 1.6 cm.  No cysts or masses.  Other findings: Trace free pelvic fluid.  IMPRESSION:  1.  Small uterine fibroids. 2.  Normal endometrium. 3.  Collapsing right ovarian cyst. 4.  Trace free  pelvic fluid.   Original Report Authenticated By: Rudie Meyer, M.D.    US Pelvis Complete  04/14/2012  *RADIOLOGY REPORT*  Clinical Data: Pelvic pain and vaginal discharge.  TRANSABDOMINAL AND TRANSVAGINAL ULTRASOUND OF PELVIS Technique:  Both transabdominal and transvaginal ultrasound examinations of the pelvis were performed. Transabdominal technique was performed for global imaging of the pelvis including uterus, ovaries, adnexal regions, and pelvic cul-de-sac.  It was necessary to proceed with endovaginal exam following the transabdominal exam to visualize the ovaries and endometrium.  Comparison:  None  Findings:  Uterus: Measures 7.2 x 3.6 x 4.9 cm.  Posterior myometrial fibroids are noted.  The largest fibroid measures 2.6 x 2.4 x 2.3 cm. Nabothian cysts are noted.  Endometrium: Normal in thickness measuring a maximum of 9 mm.  Right ovary:  Measures 3.3 x 2.9 x 3.2 cm.  A 2.2 x 1.2 x 1.2 collapsing cyst is noted.  Left ovary: Measures 3.8 x 1.6 x 1.6 cm.  No cysts or masses.  Other findings: Trace free pelvic fluid.  IMPRESSION:  1.  Small uterine fibroids. 2.  Normal endometrium. 3.  Collapsing right ovarian cyst. 4.  Trace free pelvic fluid.   Original Report Authenticated By: Rudie Meyer, M.D.     ASSESSMENT  1. Pelvic pain in female   2. Fibroids     PLAN WP done. See AVS for patient education. Reassured and reviewed US findings.  Advised trial of different positions for intercourse. Rx ibuprofen 800 mg po q8h and prn  Danae Orleans, CNM 05/05/2012 2:13 PM

## 2012-05-14 ENCOUNTER — Telehealth: Payer: Self-pay | Admitting: *Deleted

## 2012-05-14 DIAGNOSIS — N760 Acute vaginitis: Secondary | ICD-10-CM

## 2012-05-14 DIAGNOSIS — B379 Candidiasis, unspecified: Secondary | ICD-10-CM

## 2012-05-14 MED ORDER — METRONIDAZOLE 0.75 % VA GEL: 1.0000 | Freq: Every day | VAGINAL | Status: AC

## 2012-05-14 MED ORDER — FLUCONAZOLE 150 MG PO TABS: 150.0000 mg | ORAL_TABLET | Freq: Once | ORAL | Status: DC

## 2012-05-14 NOTE — Telephone Encounter (Signed)
Pt informed. She will pick up her meds at pharmacy.

## 2012-05-14 NOTE — Telephone Encounter (Signed)
Message copied by Mannie Stabile on Wed May 14, 2012 10:23 AM ------      Message from: POE, DEIRDRE C      Created: Sun May 11, 2012  2:13 PM       Please treat for Yeast and BV: Diflucan 150mg  po #2 Take 1 and repeat in 2 d prn and Metrogel as dir.

## 2013-05-28 NOTE — L&D Delivery Note (Addendum)
Final Labor Progress Note At 5284 pt reports an increased in rectal pressure.  VE C/C/+2.  FHR remained reassuring with occasional variable decelerations with quick recovery to baseline.     Vaginal Delivery Note The pt utilized an epidural as pain management.   Spontaneous rupture of membranes happened today at 0400, clear.  GBS was negative.  Cervical dilation was complete at 1420.  NICHD Category 2, FHR 145 moderate variatibility, occassional variable decels.    Pushing with guidance began at 1421.   After 12 minutes of pushing the head, shoulders and the body of a viable female infant "Glenard Haring" delivered spontaneously with maternal effort in the LOA position at 1433.    Loose  x 1, easily reduced and a body cord x1 infant somersaulted thru without difficulty.   With vigorous tone but no cry, the infant was placed on moms abd.  After the umbilical cord was clamped it was cut by the FOB. The infant was immediately handed to the NICU team staff for assessment .    Spontaneous delivery of a intact placenta with a 3 vessel cord via Shultz at 1441.   Episiotomy: None   The vulva, perineum, vaginal vault, rectum and cervix were inspected and revealed a 1 degree vaginal which was repair using a 3-0 vicryl on a CT needle and a superficial left labial and a right periurethral laceration which was repaired using a 4-0 vicryl on a SH needle.  30cc of 1% lidocaine was used during the repair.  Postpartum pitocin as ordered.  Uterus boggy immediately after delivery of placenta, fundal massage and 1000 mcg of Cytotec PR was given.   EBL 300, Pt hemodynamically stable.   Sponge, laps and needle count correct and verified with the primary care nurse.   Attending MD available at all times.    Mom was left in stable condition, the baby went to NICU. Routine postpartum orders   Mother unsure about method of contraception   Placenta to pathology: YES for IUGR. Cord Gases sent to lab: NO Cord blood  sent to lab: YES   APGARS:  8 at 1 minute and 9 at 5 minutes Weight:. 4lbs 2oz     [employer], CNM, MSN 04/04/2014. 3:28 PM

## 2013-08-31 ENCOUNTER — Emergency Department (HOSPITAL_COMMUNITY)
Admission: EM | Admit: 2013-08-31 | Discharge: 2013-08-31 | Disposition: A | Discharge status: Home / Self Care | Payer: BC Managed Care – PPO | Source: Home / Self Care

## 2013-08-31 ENCOUNTER — Encounter (HOSPITAL_COMMUNITY): Payer: Self-pay | Admitting: Emergency Medicine

## 2013-08-31 DIAGNOSIS — R102 Pelvic and perineal pain: Secondary | ICD-10-CM

## 2013-08-31 DIAGNOSIS — N949 Unspecified condition associated with female genital organs and menstrual cycle: Secondary | ICD-10-CM

## 2013-08-31 DIAGNOSIS — Z3201 Encounter for pregnancy test, result positive: Secondary | ICD-10-CM

## 2013-08-31 LAB — POCT URINALYSIS DIP (DEVICE)
Bilirubin Urine: NEGATIVE
Glucose, UA: NEGATIVE mg/dL
HGB URINE DIPSTICK: NEGATIVE
KETONES UR: NEGATIVE mg/dL
Nitrite: NEGATIVE
PH: 6 (ref 5.0–8.0)
PROTEIN: NEGATIVE mg/dL
SPECIFIC GRAVITY, URINE: 1.025 (ref 1.005–1.030)
Urobilinogen, UA: 0.2 mg/dL (ref 0.0–1.0)

## 2013-08-31 LAB — POCT PREGNANCY, URINE: Preg Test, Ur: POSITIVE — AB

## 2013-08-31 MED ORDER — PRENATAL VITAMINS 28-0.8 MG PO TABS: ORAL_TABLET | ORAL | Status: DC

## 2013-08-31 NOTE — ED Notes (Signed)
C/o pregnancy test  States she did take a test and received positive result Here for verification States she has been having cramps which started on  08/27/13

## 2013-08-31 NOTE — ED Provider Notes (Signed)
CSN: [id number]     Arrival date & time 08/31/13  8242 History   First MD Initiated Contact with Patient 08/31/13 [phone]     Chief Complaint  Patient presents with  . Possible Pregnancy   (Consider location/radiation/quality/duration/timing/severity/associated sxs/prior Treatment) HPI Comments: 28 year old female presents with a request to confirm home pregnancy test is positive. Her LMP was 07/30/2013. She was to have her menses last week and she felt a mild discomfort in the lower pelvis, more so on the right. She described it as a menstrual cramp similar to what she has had most every cycle. Over the past few days this has nearly abated. She describes no pain but only a slight occasional discomfort in the right pelvis. She had no flow or bleeding in the last 2-3 weeks. She states she is feeling better, no pain, no cramping or bleeding. She has had no vomiting has occasional nausea. Denies abdominal pain. Activity has not been affected. Denies urinary symptoms or vaginal discharge.   Past Medical History  Diagnosis Date  . Asthma   . Abnormal pap 2004   Past Surgical History  Procedure Laterality Date  . Cervical biopsy  w/ loop electrode excision  2004   Family History  Problem Relation Age of Onset  . Diabetes Maternal Aunt   . Diabetes Maternal Grandmother    History  Substance Use Topics  . Smoking status: Never Smoker   . Smokeless tobacco: Never Used  . Alcohol Use: Yes     Comment: Social Use   OB History   Grav Para Term Preterm Abortions TAB SAB Ect Mult Living   2 0 0 0 2 0 2 0 0 0      Review of Systems  Constitutional: Negative.   Respiratory: Negative.   Gastrointestinal: Negative.   Genitourinary: Positive for menstrual problem. Negative for dysuria, urgency, frequency, flank pain, vaginal bleeding, vaginal discharge, difficulty urinating, vaginal pain and pelvic pain.  Musculoskeletal: Negative.   Skin: Negative.   Neurological: Negative.     Allergies   Review of patient's allergies indicates no known allergies.  Home Medications   Current Outpatient Rx  Name  Route  Sig  Dispense  Refill  . fexofenadine (ALLEGRA) 30 MG tablet   Oral   Take 30 mg by mouth once.         . fluconazole (DIFLUCAN) 150 MG tablet   Oral   Take 1 tablet (150 mg total) by mouth once.   2 tablet   0   . ibuprofen (ADVIL,MOTRIN) 800 MG tablet   Oral   Take 1 tablet (800 mg total) by mouth every 8 (eight) hours as needed for pain.   60 tablet   1   . Prenatal Vit-Fe Fumarate-FA (PRENATAL VITAMINS) 28-0.8 MG TABS      Take 1 q d   100 tablet   0    BP 133/86  Pulse 86  Temp(Src) 98.1 F (36.7 C) (Oral)  Resp 16  SpO2 95% Physical Exam  Nursing note and vitals reviewed. Constitutional: She is oriented to person, place, and time. She appears well-developed and well-nourished. No distress.  Patient is smiling, energetic, moves quickly and well. Denies pain anywhere with movement or other activities.  Neck: Normal range of motion. Neck supple.  Cardiovascular: Normal rate and regular rhythm.   Pulmonary/Chest: Effort normal. No respiratory distress.  Abdominal: Soft. Bowel sounds are normal. She exhibits no distension and no mass. There is no tenderness. There is no rebound and  no guarding.  Genitourinary:  Palpation of the ventral pelvis reveals no tenderness, mass or other abnormality. There is a specific point in the right lateral pelvis demonstrates very mild tenderness with deep palpation of the index finger. She denies that this is the pain and only in mild discomfort. This has been improving on a daily basis.   Musculoskeletal: She exhibits no edema and no tenderness.  Neurological: She is alert and oriented to person, place, and time. She exhibits normal muscle tone.  Skin: Skin is warm and dry.  Psychiatric: She has a normal mood and affect.    ED Course  Procedures (including critical care time) Labs Review Labs Reviewed  POCT  PREGNANCY, URINE - Abnormal; Notable for the following:    Preg Test, Ur POSITIVE (*)    All other components within normal limits  POCT URINALYSIS DIP (DEVICE) - Abnormal; Notable for the following:    Leukocytes, UA SMALL (*)    All other components within normal limits   Imaging Review No results found.   MDM   1. Positive pregnancy test   2. Pelvic cramping     Detail discussion with pt and sig other about red flags, pain, bleeding, cramping or any new symptoms go stat to [facility]. There is no current pain in the pelvis. With deep palpation over the right Ovary there is mild tenderness. Pt st it does not hurt. Has been getting better since last week when she had cramping, no bleeding.       Janne Napoleon, NP 08/31/13 [phone]

## 2013-08-31 NOTE — ED Provider Notes (Signed)
Medical screening examination/treatment/procedure(s) were performed by non-physician practitioner and as supervising physician I was immediately available for consultation/collaboration.  Philipp Deputy, M.D.  Harden Mo, MD 08/31/13 [phone]

## 2013-08-31 NOTE — Discharge Instructions (Signed)
Birth Defects, Prevention A birth defect is an abnormal condition present at birth (congenital). Only 2% to 3% of babies may have a serious birth defect. Some birth defects can be treated with medications. Others can be treated with diet or surgery. Many birth defects can be prevented but some cannot. Death from a birth defect is rare.  Many birth defects can be found through screening during pregnancy. However, screening is not 100% accurate:  A test may show positive results for a defect when the child is normal (false positive).  A test result may show there is no problem when there really is a defect (false negative). Some of the screening tests include:  An ultrasound.  Blood tests for the mother.  A test of the chromosomes (chorionic villus sampling).  Amniotic fluid testing (amniocentesis).  A test of blood taken from the umbilical cord of the fetus (cordocentesis). CAUSES  Most birth defects have unknown causes. However, some defects have been traced to specific causes, such as:  Genetic defects can be caused by abnormal genes or chromosomes. These include:  Cystic fibrosis.  Sickle cell disease.  Tay-Sachs Disease.  Fragile X syndrome.  Down syndrome.  Thalassemia.  Infectious diseases. These include:  [place] measles.  Cytomegalovirus.  Toxoplasmosis.  Parvovirus.  Chickenpox.  Syphilis.  Herpes virus.  Uncontrolled diabetes before and during pregnancy.  Reoccurring convulsions (epilepsy).  Chemical agents such as:  Alcohol, which can cause lifelong physical and mental disabilities in the baby. This is called fetal alcohol syndrome (FAS).  Mercury, which is found in certain fish (shark, swordfish, and tilefish).  Lead, which is usually found in paint.  Illegal drugs.  Cigarette smoke.  Certain prescription, over-the-counter, and herbal medications. If you are taking a prescription medication, talk to your caregiver before stopping  it.  Radiation.  Chemotherapy.  Lack of folic acid, a B vitamin, before and during pregnancy.  Lack of iodine before and during your pregnancy. RISK FACTORS  Age of mother is 31 years or older.  Age of father is 56 years or older.  Mother with a history of diabetes.  Mother with a history of epilepsy.  A history of a birth defect in the parents.  A previous baby with a birth defect.  A family history of a birth defect. HOME CARE INSTRUCTIONS AND PREVENTION  See your caregiver as soon as you think you are pregnant.  Do not drink alcohol before or during pregnancy.  Do not take illegal drugs.  Do not smoke before (when possible) and during the pregnancy.  Before getting pregnant, begin taking a multivitamin as directed by your caregiver.  Take folic acid (0.4 mg) before and during pregnancy. It helps prevent spina bifida and a condition where the brain is undeveloped (anencephaly). Multivitamins usually contain folic acid. It is also found in most breakfast cereals.  Follow the directions from your caregiver about taking iodine. Your caregiver may suggest that you take 150 micrograms of iodine before and during your pregnancy. Multivitamins usually contain iodine.  Do not take more than 5,000 international units of vitamin A a day.  Eat a balanced and nutritious diet.  Avoid fish that contain mercury (shark, swordfish, and tilefish).  Avoid eating soft cheeses before and during the pregnancy.  Talk to your doctor before taking any prescription, over-the-counter, or herbal medications.  If there is a history of birth defects in the mother, father, previously born children, or family members, you may wish to have genetic counseling before getting pregnant.  If you are diabetic, make sure your diabetes is controlled before and during your pregnancy.  Get immunizations before becoming pregnant. You may need to be immunized for [place] measles, mumps, red measles,  chickenpox, hepatitis, and other diseases. Immunizations received when you are pregnant may be harmful to the baby.  If you are going to have a baby with a birth defect that will need treatment, try to have your baby at a hospital. That way, doctors, intensive care facilities, and equipment to treat and care for the baby's problem will be available. MAKE SURE YOU:   Understand these instructions.  Will watch your condition.  Will get help right away if you are not doing well or get worse. Document Released: 03/11/2007 Document Revised: 08/06/2011 Document Reviewed: 08/27/2008 [facility] Patient Information 2014 [place], [place].  Pelvic Pain, Female For any pain, getting worse, more tender, bleeding or other problems go to the [facility] hospital immediately. Female pelvic pain can be caused by many different things and start from a variety of places. Pelvic pain refers to pain that is located in the lower half of the abdomen and between your hips. The pain may occur over a short period of time (acute) or may be reoccurring (chronic). The cause of pelvic pain may be related to disorders affecting the female reproductive organs (gynecologic), but it may also be related to the bladder, kidney stones, an intestinal complication, or muscle or skeletal problems. Getting help right away for pelvic pain is important, especially if there has been severe, sharp, or a sudden onset of unusual pain. It is also important to get help right away because some types of pelvic pain can be life threatening.  CAUSES  Below are only some of the causes of pelvic pain. The causes of pelvic pain can be in one of several categories.   Gynecologic.  Pelvic inflammatory disease.  Sexually transmitted infection.  Ovarian cyst or a twisted ovarian ligament (ovarian torsion).  Uterine lining that grows outside the uterus (endometriosis).  Fibroids, cysts, or tumors.  Ovulation.  Pregnancy.  Pregnancy that occurs  outside the uterus (ectopic pregnancy).  Miscarriage.  Labor.  Abruption of the placenta or ruptured uterus.  Infection.  Uterine infection (endometritis).  Bladder infection.  Diverticulitis.  Miscarriage related to a uterine infection (septic abortion).  Bladder.  Inflammation of the bladder (cystitis).  Kidney stone(s).  Gastrointenstinal.  Constipation.  Diverticulitis.  Neurologic.  Trauma.  Feeling pelvic pain because of mental or emotional causes (psychosomatic).  Cancers of the bowel or pelvis. EVALUATION  Your caregiver will want to take a careful history of your concerns. This includes recent changes in your health, a careful gynecologic history of your periods (menses), and a sexual history. Obtaining your family history and medical history is also important. Your caregiver may suggest a pelvic exam. A pelvic exam will help identify the location and severity of the pain. It also helps in the evaluation of which organ system may be involved. In order to identify the cause of the pelvic pain and be properly treated, your caregiver may order tests. These tests may include:   A pregnancy test.  Pelvic ultrasonography.  An X-ray exam of the abdomen.  A urinalysis or evaluation of vaginal discharge.  Blood tests. HOME CARE INSTRUCTIONS   Only take over-the-counter or prescription medicines for pain, discomfort, or fever as directed by your caregiver.   Rest as directed by your caregiver.   Eat a balanced diet.   Drink enough fluids to make  your urine clear or pale yellow, or as directed.   Avoid sexual intercourse if it causes pain.   Apply warm or cold compresses to the lower abdomen depending on which one helps the pain.   Avoid stressful situations.   Keep a journal of your pelvic pain. Write down when it started, where the pain is located, and if there are things that seem to be associated with the pain, such as food or your menstrual  cycle.  Follow up with your caregiver as directed.  SEEK MEDICAL CARE IF:  Your medicine does not help your pain.  You have abnormal vaginal discharge. SEEK IMMEDIATE MEDICAL CARE IF:   You have heavy bleeding from the vagina.   Your pelvic pain increases.   You feel lightheaded or faint.   You have chills.   You have pain with urination or blood in your urine.   You have uncontrolled diarrhea or vomiting.   You have a fever or persistent symptoms for more than 3 days.  You have a fever and your symptoms suddenly get worse.   You are being physically or sexually abused.  MAKE SURE YOU:  Understand these instructions.  Will watch your condition.  Will get help if you are not doing well or get worse. Document Released: 04/10/2004 Document Revised: 11/13/2011 Document Reviewed: 09/03/2011 [facility] Patient Information 2014 [place], [place].

## 2013-09-08 LAB — OB RESULTS CONSOLE GC/CHLAMYDIA
CHLAMYDIA, DNA PROBE: NEGATIVE
Gonorrhea: NEGATIVE

## 2013-09-08 LAB — OB RESULTS CONSOLE RUBELLA ANTIBODY, IGM: Rubella: IMMUNE

## 2013-09-08 LAB — OB RESULTS CONSOLE ANTIBODY SCREEN: Antibody Screen: NEGATIVE

## 2013-09-08 LAB — OB RESULTS CONSOLE HEPATITIS B SURFACE ANTIGEN: Hepatitis B Surface Ag: NEGATIVE

## 2013-09-08 LAB — OB RESULTS CONSOLE HIV ANTIBODY (ROUTINE TESTING): HIV: NONREACTIVE

## 2013-11-19 IMAGING — US US PELVIS COMPLETE
1 series · 14 of 25 positions shown · non-contrast
Comparison: None

CLINICAL DATA: Pelvic pain and vaginal discharge.



[Series 1: us pelvis complete · 0.28mm/px · 14 of 54 slices shown]
[im 1/54]
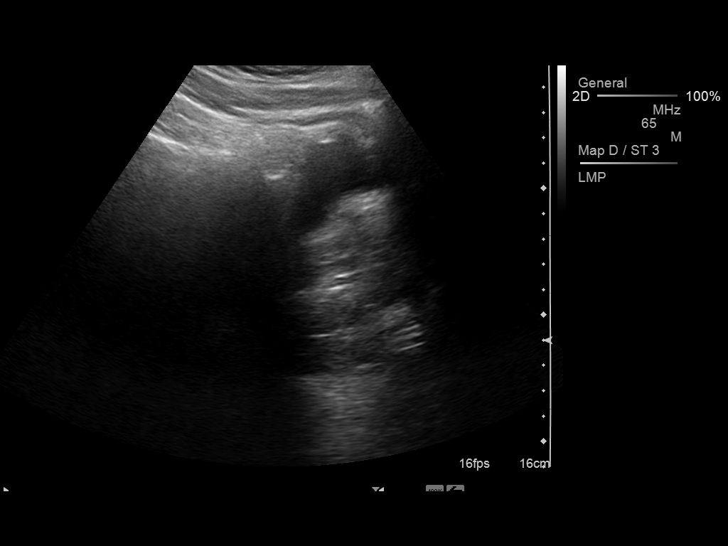
[im 5/54]
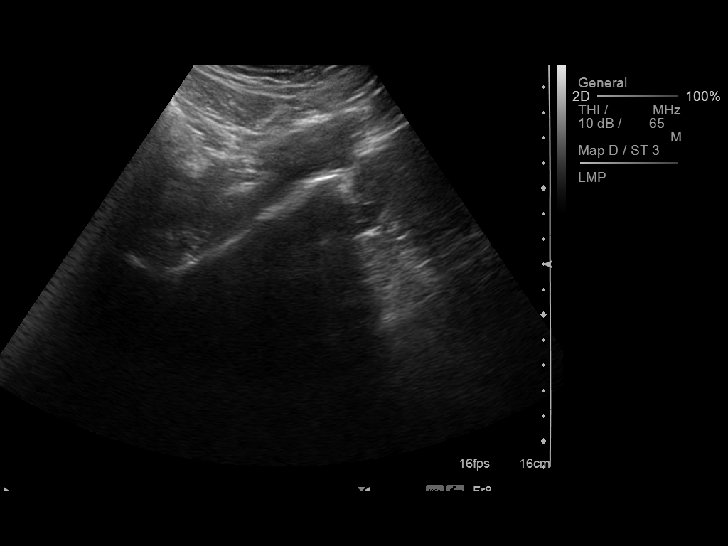
[im 9/54]
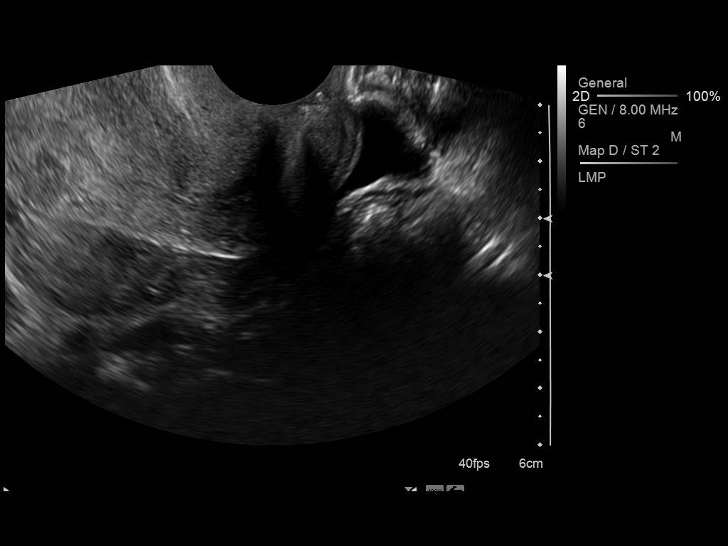
[im 14/54]
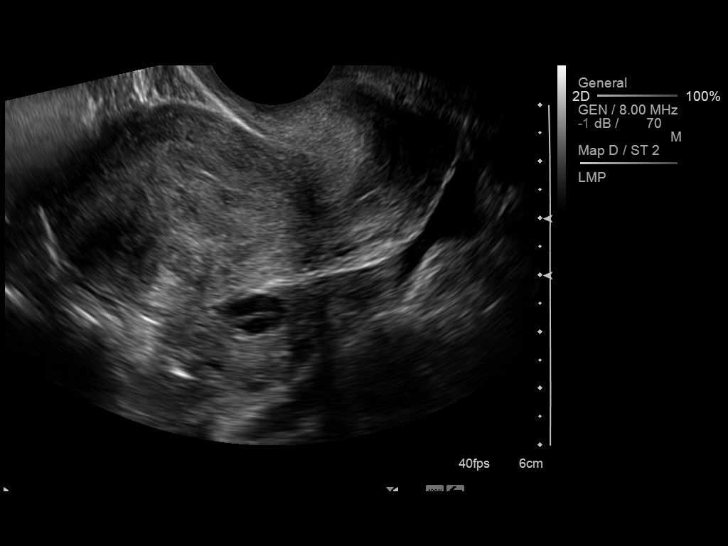
[im 18/54]
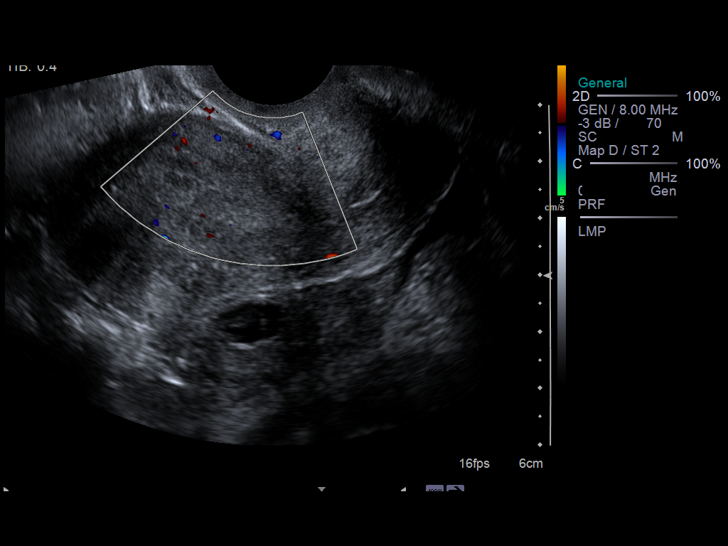
[im 20/54]
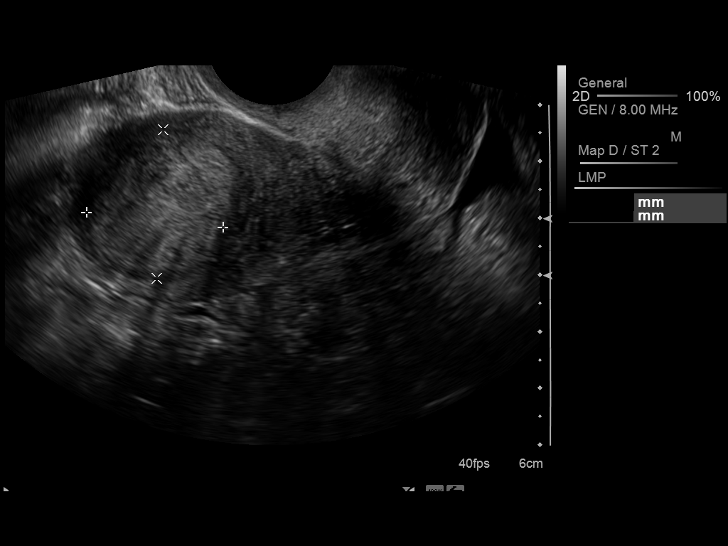
[im 25/54]
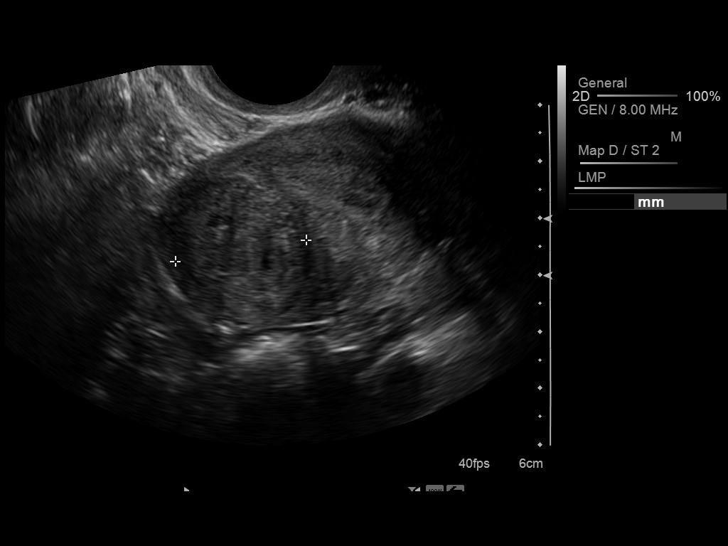
[im 29/54]
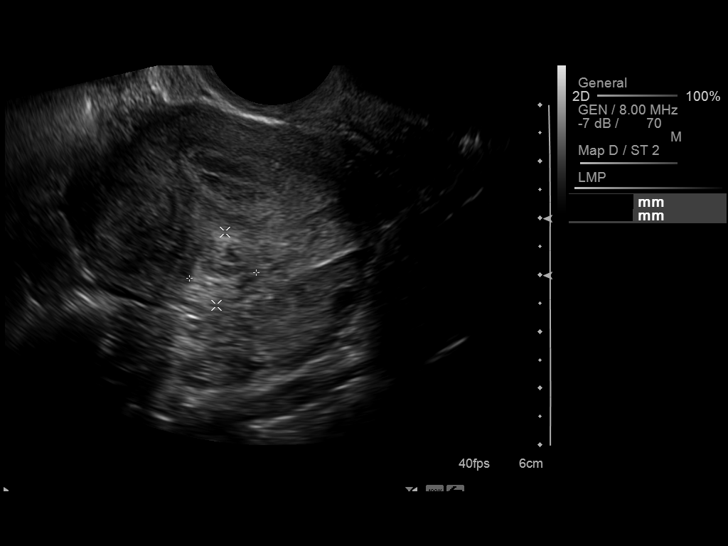
[im 34/54]
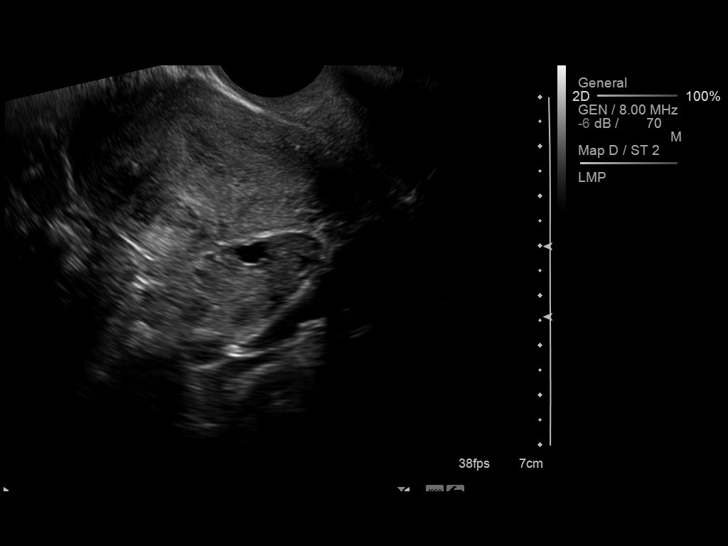
[im 36/54]
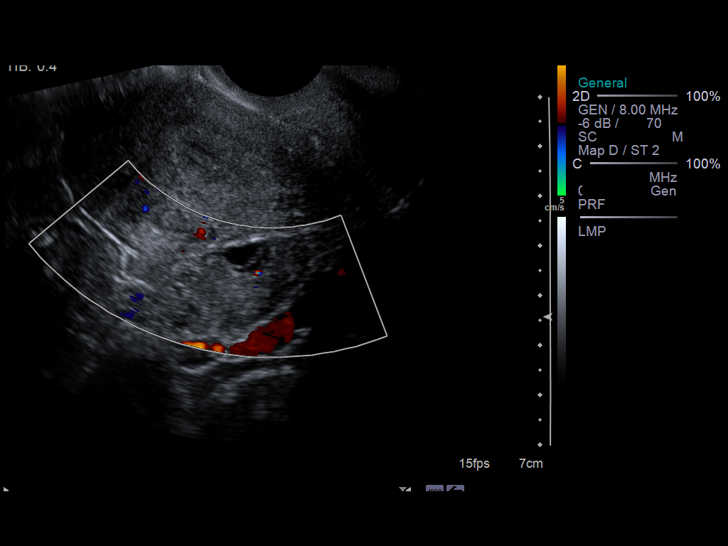
[im 40/54]
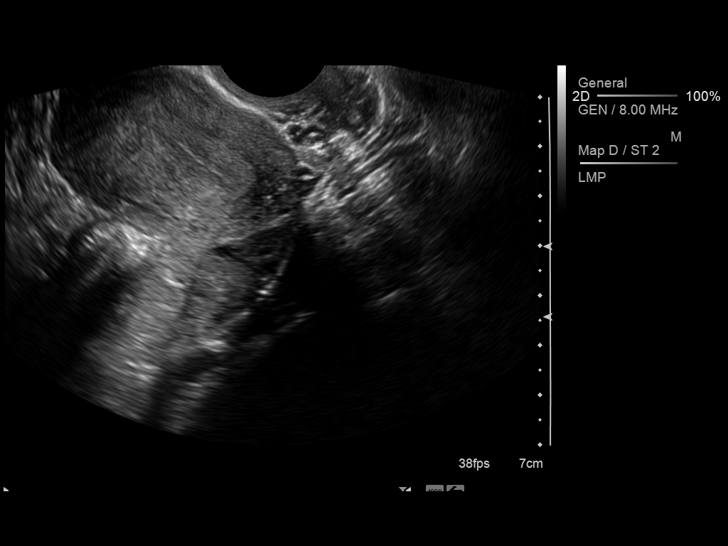
[im 45/54]
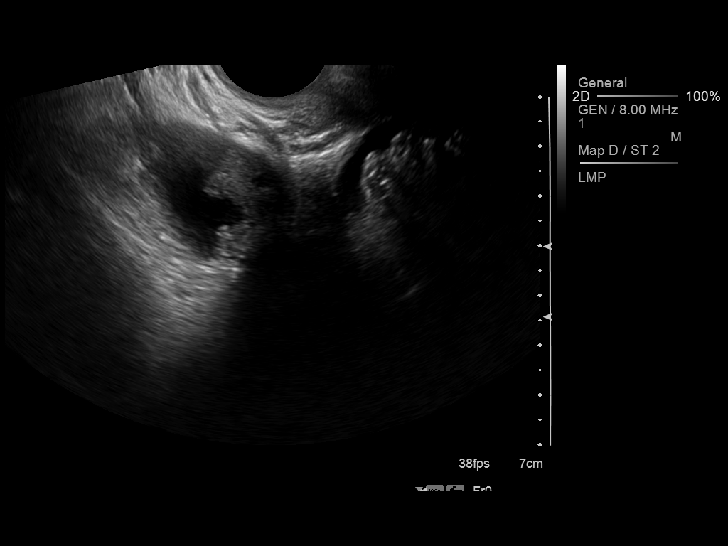
[im 49/54]
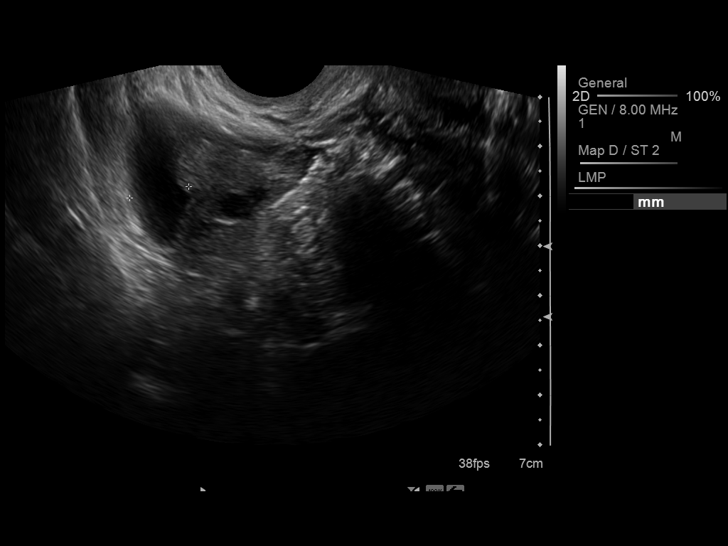
[im 54/54]
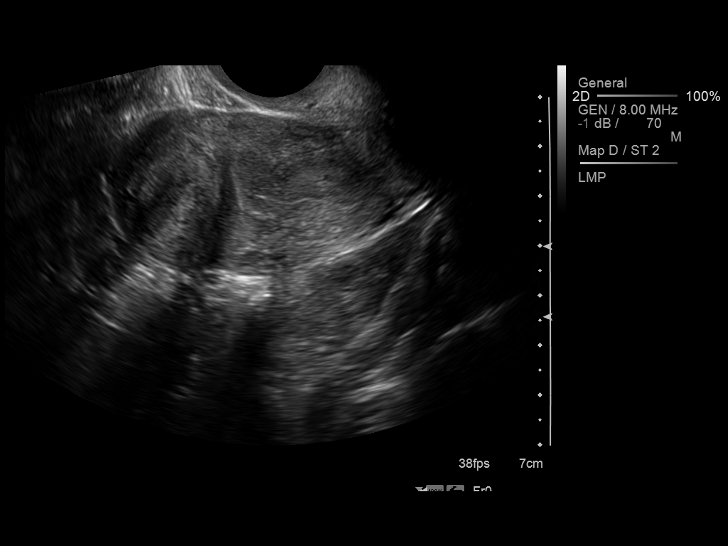

[14 of 25 positions shown; findings below may reference images not displayed]

FINDINGS: Uterus: Measures 7.2 x 3.6 x 4.9 cm.  Posterior myometrial fibroids
are noted.  The largest fibroid measures 2.6 x 2.4 x 2.3 cm.
Nabothian cysts are noted.

Endometrium: Normal in thickness measuring a maximum of 9 mm.

Right ovary:  Measures 3.3 x 2.9 x 3.2 cm.  A 2.2 x 1.2 x
collapsing cyst is noted.

Left ovary: Measures 3.8 x 1.6 x 1.6 cm.  No cysts or masses.

Other findings: Trace free pelvic fluid.
IMPRESSION: 1.  Small uterine fibroids.
2.  Normal endometrium.
3.  Collapsing right ovarian cyst.
4.  Trace free pelvic fluid..

## 2014-01-07 ENCOUNTER — Encounter (HOSPITAL_COMMUNITY): Payer: Self-pay | Admitting: Emergency Medicine

## 2014-01-07 ENCOUNTER — Emergency Department (HOSPITAL_COMMUNITY)
Admission: EM | Admit: 2014-01-07 | Discharge: 2014-01-07 | Disposition: A | Discharge status: Home / Self Care | Payer: BC Managed Care – PPO | Attending: Emergency Medicine | Admitting: Emergency Medicine

## 2014-01-07 DIAGNOSIS — K089 Disorder of teeth and supporting structures, unspecified: Secondary | ICD-10-CM | POA: Insufficient documentation

## 2014-01-07 DIAGNOSIS — J45909 Unspecified asthma, uncomplicated: Secondary | ICD-10-CM | POA: Diagnosis not present

## 2014-01-07 DIAGNOSIS — K0889 Other specified disorders of teeth and supporting structures: Secondary | ICD-10-CM

## 2014-01-07 NOTE — ED Provider Notes (Signed)
Medical screening examination/treatment/procedure(s) were performed by non-physician practitioner and as supervising physician I was immediately available for consultation/collaboration.     Veryl Speak, MD 01/07/14 [phone]

## 2014-01-07 NOTE — ED Provider Notes (Signed)
CSN: [id number]     Arrival date & time 01/07/14  0210 History   First MD Initiated Contact with Patient 01/07/14 [phone]     Chief Complaint  Patient presents with  . Dental Pain   HPI  History provided by the patient. Patient is a 28 year old female currently [redacted] weeks pregnant presenting with complaints of right upper molar pain. Patient has had waxing and waning pain for the past few days. Today pain is very difficult to sleep. She has been using Tylenol and occasionally small amounts Orajel with brief temporary relief. She denies any swelling in the mouth for the gums. No bleeding or drainage. No associated fever, chills or sweats.    Past Medical History  Diagnosis Date  . Asthma   . Abnormal pap 2004   Past Surgical History  Procedure Laterality Date  . Cervical biopsy  w/ loop electrode excision  2004   Family History  Problem Relation Age of Onset  . Diabetes Maternal Aunt   . Diabetes Maternal Grandmother    History  Substance Use Topics  . Smoking status: Never Smoker   . Smokeless tobacco: Never Used  . Alcohol Use: Yes     Comment: Social Use   OB History   Grav Para Term Preterm Abortions TAB SAB Ect Mult Living   2 0 0 0 2 0 2 0 0 0      Review of Systems  Constitutional: Negative for fever, chills and diaphoresis.  All other systems reviewed and are negative.     Allergies  Review of patient's allergies indicates no known allergies.  Home Medications   Prior to Admission medications   Medication Sig Start Date End Date Taking? Authorizing Provider  ibuprofen (ADVIL,MOTRIN) 200 MG tablet Take 400 mg by mouth every 6 (six) hours as needed for mild pain (tooth).   Yes Historical Provider, MD   BP 115/74  Pulse 88  Temp(Src) 97.9 F (36.6 C) (Oral)  Resp 20  Ht 5\' 1"  (1.549 m)  Wt 155 lb (70.308 kg)  BMI 29.30 kg/m2  SpO2 100% Physical Exam  Nursing note and vitals reviewed. Constitutional: She is oriented to person, place, and time. She appears  well-developed and well-nourished. No distress.  HENT:  Head: Normocephalic.  Mouth/Throat: Oropharynx is clear and moist.    Pain to percussion of the right upper third molar. No significant swelling of the gums or signs of drainable dental abscess.  Cardiovascular: Normal rate and regular rhythm.   Pulmonary/Chest: Effort normal and breath sounds normal. No respiratory distress.  Abdominal: Soft.  Neurological: She is alert and oriented to person, place, and time.  Skin: Skin is warm and dry. No rash noted.  Psychiatric: She has a normal mood and affect. Her behavior is normal.    ED Course  Procedures   COORDINATION OF CARE:  Nursing notes reviewed. Vital signs reviewed. Initial pt interview and examination performed.   Filed Vitals:   01/07/14 0214  BP: 115/74  Pulse: 88  Temp: 97.9 F (36.6 C)  TempSrc: Oral  Resp: 20  Height: 5\' 1"  (1.549 m)  Weight: 155 lb (70.308 kg)  SpO2: 100%    3:59 AM-the patient seen and evaluated. She appears well is the appearance of ear pain or discomfort. I discussed with the patient possible treatments however these are limited due to pregnancy. Explain there may be some small amounts of risk with certain medications besides Tylenol for pain. At this time she prefers to not have any  additional treatment and would like to followup with a dentist. We'll plan to give dental referral for her to call later this morning.     MDM   Final diagnoses:  Pain, dental        Martie Lee, PA-C 01/07/14 [phone]

## 2014-01-07 NOTE — ED Notes (Signed)
Pt reports pain to upper R wisdom tooth that is possibly broke

## 2014-01-07 NOTE — Discharge Instructions (Signed)
Please follow up with a dentist later today for further evaluation.   Dental Pain A tooth ache may be caused by cavities (tooth decay). Cavities expose the nerve of the tooth to air and hot or cold temperatures. It may come from an infection or abscess (also called a boil or furuncle) around your tooth. It is also often caused by dental caries (tooth decay). This causes the pain you are having. DIAGNOSIS  Your caregiver can diagnose this problem by exam. TREATMENT   If caused by an infection, it may be treated with medications which kill germs (antibiotics) and pain medications as prescribed by your caregiver. Take medications as directed.  Only take over-the-counter or prescription medicines for pain, discomfort, or fever as directed by your caregiver.  Whether the tooth ache today is caused by infection or dental disease, you should see your dentist as soon as possible for further care. SEEK MEDICAL CARE IF: The exam and treatment you received today has been provided on an emergency basis only. This is not a substitute for complete medical or dental care. If your problem worsens or new problems (symptoms) appear, and you are unable to meet with your dentist, call or return to this location. SEEK IMMEDIATE MEDICAL CARE IF:   You have a fever.  You develop redness and swelling of your face, jaw, or neck.  You are unable to open your mouth.  You have severe pain uncontrolled by pain medicine. MAKE SURE YOU:   Understand these instructions.  Will watch your condition.  Will get help right away if you are not doing well or get worse. Document Released: 05/14/2005 Document Revised: 08/06/2011 Document Reviewed: 12/31/2007 [facility] Patient Information 2015 [place], [place]. This information is not intended to replace advice given to you by your health care provider. Make sure you discuss any questions you have with your health care provider.

## 2014-01-08 ENCOUNTER — Emergency Department (HOSPITAL_COMMUNITY)
Admission: EM | Admit: 2014-01-08 | Discharge: 2014-01-08 | Disposition: A | Discharge status: Home / Self Care | Payer: BC Managed Care – PPO | Attending: Emergency Medicine | Admitting: Emergency Medicine

## 2014-01-08 ENCOUNTER — Encounter (HOSPITAL_COMMUNITY): Payer: Self-pay | Admitting: Emergency Medicine

## 2014-01-08 DIAGNOSIS — O9989 Other specified diseases and conditions complicating pregnancy, childbirth and the puerperium: Secondary | ICD-10-CM | POA: Insufficient documentation

## 2014-01-08 DIAGNOSIS — K089 Disorder of teeth and supporting structures, unspecified: Secondary | ICD-10-CM | POA: Diagnosis not present

## 2014-01-08 DIAGNOSIS — K0889 Other specified disorders of teeth and supporting structures: Secondary | ICD-10-CM

## 2014-01-08 DIAGNOSIS — J45909 Unspecified asthma, uncomplicated: Secondary | ICD-10-CM | POA: Diagnosis not present

## 2014-01-08 MED ORDER — HYDROCODONE-ACETAMINOPHEN 5-325 MG PO TABS: 1.0000 | ORAL_TABLET | ORAL | Status: DC | PRN

## 2014-01-08 NOTE — ED Provider Notes (Signed)
Medical screening examination/treatment/procedure(s) were performed by non-physician practitioner and as supervising physician I was immediately available for consultation/collaboration.   EKG Interpretation None        Hoy Morn, MD 01/08/14 2209

## 2014-01-08 NOTE — Discharge Instructions (Signed)
Take the prescribed medication as directed.  Be aware, this may cause fetal harm so take at your own risk Follow-up with dentist. Return to the ED for new or worsening symptoms.

## 2014-01-08 NOTE — ED Provider Notes (Signed)
CSN: [id number]     Arrival date & time 01/08/14  1204 History  This chart was scribed for Vilinda Blanks. Mathews Argyle, working with Hoy Morn, MD by Steva Colder, ED Scribe. The patient was seen in room TR10C/TR10C at 12:17 PM.    Chief Complaint  Patient presents with  . Dental Pain     The history is provided by the patient. No language interpreter was used.   HPI Comments: Shelly Bradley is a 28 y.o. female who presents to the Emergency Department complaining of right top dental pain onset 2 days. She states that she was seen here yesterday for her pain. She states that the pain shoots up the whole right side of her face. She states that she is unable to see a dentist because she has been unable to get in. She states that she has not slept in two days. She states that she has allergies. She states that she is [redacted] weeks pregnant. She voices concerns for any medications that will be safe for her baby and that will aid in her pain. She states that she has tried Tylenol and Orajel with no relief for her symptoms. She denies fever and any other associated symptoms.  Pt denies current abdominal pain, vaginal bleeding, cramping, or loss of vaginal fluid.   Past Medical History  Diagnosis Date  . Asthma   . Abnormal pap 2004   Past Surgical History  Procedure Laterality Date  . Cervical biopsy  w/ loop electrode excision  2004   Family History  Problem Relation Age of Onset  . Diabetes Maternal Aunt   . Diabetes Maternal Grandmother    History  Substance Use Topics  . Smoking status: Never Smoker   . Smokeless tobacco: Never Used  . Alcohol Use: Yes     Comment: Social Use   OB History   Grav Para Term Preterm Abortions TAB SAB Ect Mult Living   2 0 0 0 2 0 2 0 0 0      Review of Systems  Constitutional: Negative for fever.  HENT: Positive for dental problem (right upper).   All other systems reviewed and are negative.     Allergies  Review of patient's allergies indicates no  known allergies.  Home Medications   Prior to Admission medications   Medication Sig Start Date End Date Taking? Authorizing Provider  ibuprofen (ADVIL,MOTRIN) 200 MG tablet Take 400 mg by mouth every 6 (six) hours as needed for mild pain (tooth).    Historical Provider, MD   BP 120/75  Pulse 105  Temp(Src) 98.2 F (36.8 C) (Oral)  Resp 20  SpO2 98%  Physical Exam  Nursing note and vitals reviewed. Constitutional: She is oriented to person, place, and time. She appears well-developed and well-nourished.  HENT:  Head: Normocephalic and atraumatic.  Right Ear: Tympanic membrane, external ear and ear canal normal.  Left Ear: Tympanic membrane, external ear and ear canal normal.  Mouth/Throat: Uvula is midline, oropharynx is clear and moist and mucous membranes are normal. Normal dentition. No dental abscesses or dental caries.  Teeth largely in good dentition,  Right upper molar tender to palpation, surrounding gingiva normal in appearance, handling secretions appropriately, no trismus  Eyes: Conjunctivae and EOM are normal. Pupils are equal, round, and reactive to light.  Neck: Normal range of motion.  Cardiovascular: Normal rate, regular rhythm and normal heart sounds.   Pulmonary/Chest: Effort normal and breath sounds normal.  Abdominal: Soft. Bowel sounds are normal.  Musculoskeletal: Normal range of motion.  Neurological: She is alert and oriented to person, place, and time.  Skin: Skin is warm and dry.  Psychiatric: She has a normal mood and affect.    ED Course  Procedures (including critical care time) DIAGNOSTIC STUDIES: Oxygen Saturation is 98% on room air, normal by my interpretation.    COORDINATION OF CARE: 12:22 PM-Discussed treatment plan which includes Norco with pt at bedside and pt agreed to plan.  Patient aware of potential adverse fetal harm.  Labs Review Labs Reviewed - No data to display  Imaging Review No results found.   EKG Interpretation None       MDM   Final diagnoses:  Pain, dental   Dental pain without signs of dental abscess.  Afebrile and non-toxic appearing.  Patient has been attempting conservative treatment without relief.  I have advised her of the risks against taking narcotic pain medication and potential fetal risk while pregnant.  She acknowledged these risks but still would like pain medication until she can follow-up with her dentist, pt states she will use conservative.  She will FU with dentist, further referrals given.  Discussed plan with patient, he/she acknowledged understanding and agreed with plan of care.  Return precautions given for new or worsening symptoms.  I personally performed the services described in this documentation, which was scribed in my presence. The recorded information has been reviewed and is accurate.   Larene Pickett, PA-C 01/08/14 1241

## 2014-01-08 NOTE — ED Notes (Signed)
Pt was here yesterday for dental pain. Pt is [redacted] weeks pregnant. Unable to get in to see a dentist. Pt is tearful, " you have to do something.Marland KitchenMarland KitchenI haven't slept in 2 days."

## 2014-01-08 NOTE — ED Notes (Signed)
Pt requesting her ears be examined.

## 2014-03-05 ENCOUNTER — Other Ambulatory Visit (HOSPITAL_COMMUNITY): Payer: Self-pay | Admitting: Obstetrics and Gynecology

## 2014-03-05 DIAGNOSIS — IMO0002 Reserved for concepts with insufficient information to code with codable children: Secondary | ICD-10-CM

## 2014-03-09 ENCOUNTER — Ambulatory Visit (HOSPITAL_COMMUNITY)
Admission: RE | Admit: 2014-03-09 | Discharge: 2014-03-09 | Disposition: A | Discharge status: Home / Self Care | Payer: BC Managed Care – PPO | Source: Ambulatory Visit | Attending: Obstetrics and Gynecology | Admitting: Obstetrics and Gynecology

## 2014-03-09 ENCOUNTER — Encounter (HOSPITAL_COMMUNITY): Payer: Self-pay

## 2014-03-09 ENCOUNTER — Other Ambulatory Visit (HOSPITAL_COMMUNITY): Payer: Self-pay | Admitting: Obstetrics and Gynecology

## 2014-03-09 VITALS — BP 125/75 | HR 98 | Wt 158.8 lb

## 2014-03-09 DIAGNOSIS — IMO0002 Reserved for concepts with insufficient information to code with codable children: Secondary | ICD-10-CM

## 2014-03-09 DIAGNOSIS — O365930 Maternal care for other known or suspected poor fetal growth, third trimester, not applicable or unspecified: Secondary | ICD-10-CM | POA: Diagnosis present

## 2014-03-09 DIAGNOSIS — Z3A31 31 weeks gestation of pregnancy: Secondary | ICD-10-CM

## 2014-03-09 DIAGNOSIS — Z3689 Encounter for other specified antenatal screening: Secondary | ICD-10-CM

## 2014-03-09 DIAGNOSIS — Z36 Encounter for antenatal screening of mother: Secondary | ICD-10-CM | POA: Diagnosis not present

## 2014-03-09 NOTE — Consult Note (Signed)
MFM consult  28 yr old G3P0020 at [redacted]w[redacted]d with finding of lagging fetal growth on outside ultrasound referred by Dr. Mancel Bale for fetal ultrasound and consult.  Patient reports no medical history and reports this pregnancy has been uncomplicated except for she has only gained 7lbs. She reports recent nausea and reflux.   Ultrasound today shows: single intrauterine pregnancy. Estimated fetal weight is in the 22nd%. Anterior placenta without evidence of previa. Normal amniotic fluid index. The views of the heart and ankles are limited. The remainder of the limited anatomy survey is normal. Normal umbilical artery Doppler studies.  I counseled the patient as follows: 1. Appropriate fetal growth; although at low end of normal: - recommend follow up growth in 2-3 weeks - if develops growth restriction (overall growth <10th% or abdominal circumference <3rd%) then recommend twice weekly NSTs and weekly AFI and umbilical artery Doppler studies; if fetal growth restriction remains uncomplicated (normal testing, Dopplers, AFI, and appropriate interval growth) recommend delivery at 38-39 weeks or sooner if clinically indicated - if growth remains normal no alteration of management is indicated - recommend fetal kick counts 2. Patient reports normal first trimester screen 3. Normal AFP 4. Normal limited anatomy survey.  I spent a total of 20 minutes with the patient of which >50% was in face to face consultation.  Please call with questions.  [place], MD

## 2014-03-29 ENCOUNTER — Encounter (HOSPITAL_COMMUNITY): Payer: Self-pay

## 2014-03-30 LAB — OB RESULTS CONSOLE GBS: STREP GROUP B AG: NEGATIVE

## 2014-04-04 ENCOUNTER — Inpatient Hospital Stay (HOSPITAL_COMMUNITY)
Admission: AD | Admit: 2014-04-04 | Discharge: 2014-04-06 | DRG: 775 | Disposition: A | Discharge status: Home / Self Care | Payer: Medicaid Other | Source: Ambulatory Visit | Attending: Obstetrics and Gynecology | Admitting: Obstetrics and Gynecology

## 2014-04-04 ENCOUNTER — Inpatient Hospital Stay (HOSPITAL_COMMUNITY): Payer: Medicaid Other | Admitting: Anesthesiology

## 2014-04-04 ENCOUNTER — Encounter (HOSPITAL_COMMUNITY): Payer: Self-pay

## 2014-04-04 DIAGNOSIS — O42913 Preterm premature rupture of membranes, unspecified as to length of time between rupture and onset of labor, third trimester: Secondary | ICD-10-CM | POA: Diagnosis present

## 2014-04-04 DIAGNOSIS — O365930 Maternal care for other known or suspected poor fetal growth, third trimester, not applicable or unspecified: Secondary | ICD-10-CM | POA: Diagnosis present

## 2014-04-04 DIAGNOSIS — O9902 Anemia complicating childbirth: Secondary | ICD-10-CM | POA: Diagnosis present

## 2014-04-04 DIAGNOSIS — D649 Anemia, unspecified: Secondary | ICD-10-CM | POA: Diagnosis present

## 2014-04-04 DIAGNOSIS — Z3A35 35 weeks gestation of pregnancy: Secondary | ICD-10-CM | POA: Diagnosis present

## 2014-04-04 DIAGNOSIS — Z3483 Encounter for supervision of other normal pregnancy, third trimester: Secondary | ICD-10-CM | POA: Diagnosis present

## 2014-04-04 HISTORY — DX: Benign neoplasm of connective and other soft tissue, unspecified: D21.9

## 2014-04-04 LAB — URINE MICROSCOPIC-ADD ON

## 2014-04-04 LAB — CBC
HEMATOCRIT: 29.5 % — AB (ref 36.0–46.0)
HEMOGLOBIN: 9.4 g/dL — AB (ref 12.0–15.0)
MCH: 23.4 pg — ABNORMAL LOW (ref 26.0–34.0)
MCHC: 31.9 g/dL (ref 30.0–36.0)
MCV: 73.4 fL — ABNORMAL LOW (ref 78.0–100.0)
Platelets: 200 10*3/uL (ref 150–400)
RBC: 4.02 MIL/uL (ref 3.87–5.11)
RDW: 15.3 % (ref 11.5–15.5)
WBC: 10.2 10*3/uL (ref 4.0–10.5)

## 2014-04-04 LAB — URINALYSIS, ROUTINE W REFLEX MICROSCOPIC
Bilirubin Urine: NEGATIVE
GLUCOSE, UA: NEGATIVE mg/dL
Ketones, ur: 15 mg/dL — AB
Nitrite: NEGATIVE
PROTEIN: NEGATIVE mg/dL
Specific Gravity, Urine: <1.005 — ABNORMAL LOW (ref 1.005–1.030)
Urobilinogen, UA: 0.2 mg/dL (ref 0.0–1.0)
pH: 6.5 (ref 5.0–8.0)

## 2014-04-04 LAB — TYPE AND SCREEN
ABO/RH(D): A POS
Antibody Screen: NEGATIVE

## 2014-04-04 LAB — POCT FERN TEST: POCT Fern Test: POSITIVE

## 2014-04-04 MED ORDER — CITRIC ACID-SODIUM CITRATE 334-500 MG/5ML PO SOLN: 30.0000 mL | ORAL | Status: DC | PRN

## 2014-04-04 MED ORDER — MISOPROSTOL 200 MCG PO TABS
ORAL_TABLET | ORAL | Status: AC
  Filled 2014-04-04: qty 5

## 2014-04-04 MED ORDER — ACETAMINOPHEN 325 MG PO TABS: 650.0000 mg | ORAL_TABLET | ORAL | Status: DC | PRN

## 2014-04-04 MED ORDER — OXYTOCIN 40 UNITS IN LACTATED RINGERS INFUSION - SIMPLE MED
1.0000 m[IU]/min | INTRAVENOUS | Status: DC
  Administered 2014-04-04: 2 m[IU]/min via INTRAVENOUS

## 2014-04-04 MED ORDER — LACTATED RINGERS IV SOLN
INTRAVENOUS | Status: DC
  Administered 2014-04-04 (×2): via INTRAVENOUS

## 2014-04-04 MED ORDER — ZOLPIDEM TARTRATE 5 MG PO TABS: 5.0000 mg | ORAL_TABLET | Freq: Every evening | ORAL | Status: DC | PRN

## 2014-04-04 MED ORDER — OXYCODONE-ACETAMINOPHEN 5-325 MG PO TABS: 2.0000 | ORAL_TABLET | ORAL | Status: DC | PRN

## 2014-04-04 MED ORDER — NALBUPHINE HCL 10 MG/ML IJ SOLN
10.0000 mg | INTRAMUSCULAR | Status: DC | PRN
  Administered 2014-04-04 (×2): 10 mg via INTRAVENOUS
  Filled 2014-04-04 (×2): qty 1

## 2014-04-04 MED ORDER — LANOLIN HYDROUS EX OINT: TOPICAL_OINTMENT | CUTANEOUS | Status: DC | PRN

## 2014-04-04 MED ORDER — WITCH HAZEL-GLYCERIN EX PADS: 1.0000 "application " | MEDICATED_PAD | CUTANEOUS | Status: DC | PRN

## 2014-04-04 MED ORDER — INFLUENZA VAC SPLIT QUAD 0.5 ML IM SUSY
0.5000 mL | PREFILLED_SYRINGE | INTRAMUSCULAR | Status: DC
Start: 2014-04-05 — End: 2014-04-06

## 2014-04-04 MED ORDER — SENNOSIDES-DOCUSATE SODIUM 8.6-50 MG PO TABS
2.0000 | ORAL_TABLET | ORAL | Status: DC
  Administered 2014-04-04 – 2014-04-05 (×2): 2 via ORAL
  Filled 2014-04-04 (×2): qty 2

## 2014-04-04 MED ORDER — EPHEDRINE 5 MG/ML INJ
10.0000 mg | INTRAVENOUS | Status: DC | PRN
  Filled 2014-04-04: qty 2

## 2014-04-04 MED ORDER — LACTATED RINGERS IV SOLN: 500.0000 mL | INTRAVENOUS | Status: DC | PRN

## 2014-04-04 MED ORDER — OXYTOCIN BOLUS FROM INFUSION: 500.0000 mL | INTRAVENOUS | Status: DC

## 2014-04-04 MED ORDER — PHENYLEPHRINE 40 MCG/ML (10ML) SYRINGE FOR IV PUSH (FOR BLOOD PRESSURE SUPPORT)
80.0000 ug | PREFILLED_SYRINGE | INTRAVENOUS | Status: DC | PRN
  Filled 2014-04-04: qty 2

## 2014-04-04 MED ORDER — DIPHENHYDRAMINE HCL 50 MG/ML IJ SOLN: 12.5000 mg | INTRAMUSCULAR | Status: DC | PRN

## 2014-04-04 MED ORDER — OXYCODONE-ACETAMINOPHEN 5-325 MG PO TABS: 1.0000 | ORAL_TABLET | ORAL | Status: DC | PRN

## 2014-04-04 MED ORDER — LIDOCAINE HCL (PF) 1 % IJ SOLN
30.0000 mL | INTRAMUSCULAR | Status: AC | PRN
  Administered 2014-04-04: 30 mL via SUBCUTANEOUS
  Filled 2014-04-04: qty 30

## 2014-04-04 MED ORDER — DIBUCAINE 1 % RE OINT: 1.0000 "application " | TOPICAL_OINTMENT | RECTAL | Status: DC | PRN

## 2014-04-04 MED ORDER — ONDANSETRON HCL 4 MG/2ML IJ SOLN
4.0000 mg | Freq: Four times a day (QID) | INTRAMUSCULAR | Status: DC | PRN
  Administered 2014-04-04: 4 mg via INTRAVENOUS
  Filled 2014-04-04: qty 2

## 2014-04-04 MED ORDER — LIDOCAINE HCL (PF) 1 % IJ SOLN
INTRAMUSCULAR | Status: DC | PRN
  Administered 2014-04-04 (×2): 5 mL

## 2014-04-04 MED ORDER — ONDANSETRON HCL 4 MG PO TABS: 4.0000 mg | ORAL_TABLET | ORAL | Status: DC | PRN

## 2014-04-04 MED ORDER — ONDANSETRON HCL 4 MG/2ML IJ SOLN: 4.0000 mg | INTRAMUSCULAR | Status: DC | PRN

## 2014-04-04 MED ORDER — PHENYLEPHRINE 40 MCG/ML (10ML) SYRINGE FOR IV PUSH (FOR BLOOD PRESSURE SUPPORT)
80.0000 ug | PREFILLED_SYRINGE | INTRAVENOUS | Status: DC | PRN
  Filled 2014-04-04: qty 2
  Filled 2014-04-04: qty 10

## 2014-04-04 MED ORDER — NITROFURANTOIN MONOHYD MACRO 100 MG PO CAPS
100.0000 mg | ORAL_CAPSULE | Freq: Two times a day (BID) | ORAL | Status: DC
  Administered 2014-04-04 – 2014-04-06 (×4): 100 mg via ORAL
  Filled 2014-04-04 (×6): qty 1

## 2014-04-04 MED ORDER — BENZOCAINE-MENTHOL 20-0.5 % EX AERO
1.0000 "application " | INHALATION_SPRAY | CUTANEOUS | Status: DC | PRN
  Administered 2014-04-04: 1 via TOPICAL
  Filled 2014-04-04: qty 56

## 2014-04-04 MED ORDER — IBUPROFEN 600 MG PO TABS
600.0000 mg | ORAL_TABLET | Freq: Four times a day (QID) | ORAL | Status: DC
  Administered 2014-04-04 – 2014-04-06 (×8): 600 mg via ORAL
  Filled 2014-04-04 (×8): qty 1

## 2014-04-04 MED ORDER — FERROUS SULFATE 325 (65 FE) MG PO TABS
325.0000 mg | ORAL_TABLET | Freq: Two times a day (BID) | ORAL | Status: DC
  Administered 2014-04-04 – 2014-04-06 (×4): 325 mg via ORAL
  Filled 2014-04-04 (×4): qty 1

## 2014-04-04 MED ORDER — LACTATED RINGERS IV SOLN
500.0000 mL | Freq: Once | INTRAVENOUS | Status: AC
  Administered 2014-04-04: 500 mL via INTRAVENOUS

## 2014-04-04 MED ORDER — TETANUS-DIPHTH-ACELL PERTUSSIS 5-2.5-18.5 LF-MCG/0.5 IM SUSP
0.5000 mL | Freq: Once | INTRAMUSCULAR | Status: AC
  Administered 2014-04-05: 0.5 mL via INTRAMUSCULAR
  Filled 2014-04-04: qty 0.5

## 2014-04-04 MED ORDER — OXYTOCIN 40 UNITS IN LACTATED RINGERS INFUSION - SIMPLE MED
62.5000 mL/h | INTRAVENOUS | Status: DC
  Administered 2014-04-04: 62.5 mL/h via INTRAVENOUS
  Filled 2014-04-04: qty 1000

## 2014-04-04 MED ORDER — PRENATAL MULTIVITAMIN CH
1.0000 | ORAL_TABLET | Freq: Every day | ORAL | Status: DC
  Administered 2014-04-05 – 2014-04-06 (×2): 1 via ORAL
  Filled 2014-04-04 (×2): qty 1

## 2014-04-04 MED ORDER — TERBUTALINE SULFATE 1 MG/ML IJ SOLN: 0.2500 mg | Freq: Once | INTRAMUSCULAR | Status: DC | PRN

## 2014-04-04 MED ORDER — FENTANYL 2.5 MCG/ML BUPIVACAINE 1/10 % EPIDURAL INFUSION (WH - ANES)
14.0000 mL/h | INTRAMUSCULAR | Status: DC | PRN
Start: 2014-04-04 — End: 2014-04-04
  Administered 2014-04-04: 14 mL/h via EPIDURAL
  Filled 2014-04-04: qty 125

## 2014-04-04 MED ORDER — SIMETHICONE 80 MG PO CHEW: 80.0000 mg | CHEWABLE_TABLET | ORAL | Status: DC | PRN

## 2014-04-04 MED ORDER — DIPHENHYDRAMINE HCL 25 MG PO CAPS: 25.0000 mg | ORAL_CAPSULE | Freq: Four times a day (QID) | ORAL | Status: DC | PRN

## 2014-04-04 MED ORDER — MISOPROSTOL 200 MCG PO TABS
1000.0000 ug | ORAL_TABLET | Freq: Once | ORAL | Status: AC
  Administered 2014-04-04: 1000 ug via VAGINAL

## 2014-04-04 NOTE — Progress Notes (Signed)
Breast pump set up and initiated with patient.  Supplies, cleaning, milk storeage, supply & demand discussed.  No questions at this time.  Leighton Roach, RN------------

## 2014-04-04 NOTE — MAU Note (Signed)
Water broke at 0400. Contractions every 8 min since arriving at hospital.  Baby moving well. Clear with pink fluid.  No bleeding.

## 2014-04-04 NOTE — Anesthesia Preprocedure Evaluation (Signed)
Anesthesia Evaluation  Patient identified by MRN, date of birth, ID band Patient awake    Reviewed: Allergy & Precautions, H&P , Patient's Chart, lab work & pertinent test results  Airway Mallampati: II  TM Distance: >3 FB Neck ROM: full    Dental   Pulmonary asthma ,  breath sounds clear to auscultation        Cardiovascular Rhythm:regular Rate:Normal     Neuro/Psych    GI/Hepatic   Endo/Other    Renal/GU      Musculoskeletal   Abdominal   Peds  Hematology   Anesthesia Other Findings   Reproductive/Obstetrics (+) Pregnancy                             Anesthesia Physical Anesthesia Plan  ASA: II  Anesthesia Plan: Epidural   Post-op Pain Management:    Induction:   Airway Management Planned:   Additional Equipment:   Intra-op Plan:   Post-operative Plan:   Informed Consent: I have reviewed the patients History and Physical, chart, labs and discussed the procedure including the risks, benefits and alternatives for the proposed anesthesia with the patient or authorized representative who has indicated his/her understanding and acceptance.     Plan Discussed with:   Anesthesia Plan Comments:         Anesthesia Quick Evaluation  

## 2014-04-04 NOTE — Progress Notes (Signed)
Delivery of live viable female by V. Standard, CNM. APGARs 8,8

## 2014-04-04 NOTE — Progress Notes (Signed)
Addendum Note  Nurse called to report the pt c/o discomfort during voiding prior to labor began.  Urine cultures sent.  Results for orders placed or performed during the hospital encounter of 04/04/14 (from the past 24 hour(s))  CBC     Status: Abnormal   Collection Time: 04/04/14  6:55 AM  Result Value Ref Range   WBC 10.2 4.0 - 10.5 K/uL   RBC 4.02 3.87 - 5.11 MIL/uL   Hemoglobin 9.4 (L) 12.0 - 15.0 g/dL   HCT 29.5 (L) 36.0 - 46.0 %   MCV 73.4 (L) 78.0 - 100.0 fL   MCH 23.4 (L) 26.0 - 34.0 pg   MCHC 31.9 30.0 - 36.0 g/dL   RDW 15.3 11.5 - 15.5 %   Platelets 200 150 - 400 K/uL  Type and screen     Status: None   Collection Time: 04/04/14  6:55 AM  Result Value Ref Range   ABO/RH(D) A POS    Antibody Screen NEG    Sample Expiration 04/07/2014   Fern Test     Status: None   Collection Time: 04/04/14  7:38 AM  Result Value Ref Range   POCT Fern Test Positive = ruptured amniotic membanes   Urinalysis, Routine w reflex microscopic     Status: Abnormal   Collection Time: 04/04/14  4:30 PM  Result Value Ref Range   Color, Urine RED (A) YELLOW   APPearance HAZY (A) CLEAR   Specific Gravity, Urine <1.005 (L) 1.005 - 1.030   pH 6.5 5.0 - 8.0   Glucose, UA NEGATIVE NEGATIVE mg/dL   Hgb urine dipstick LARGE (A) NEGATIVE   Bilirubin Urine NEGATIVE NEGATIVE   Ketones, ur 15 (A) NEGATIVE mg/dL   Protein, ur NEGATIVE NEGATIVE mg/dL   Urobilinogen, UA 0.2 0.0 - 1.0 mg/dL   Nitrite NEGATIVE NEGATIVE   Leukocytes, UA TRACE (A) NEGATIVE  Urine microscopic-add on     Status: Abnormal   Collection Time: 04/04/14  4:30 PM  Result Value Ref Range   Squamous Epithelial / LPF FEW (A) RARE   WBC, UA 0-2 <3 WBC/hpf   RBC / HPF TOO NUMEROUS TO COUNT <3 RBC/hpf   Bacteria, UA MANY (A) RARE     Plan: Rx for Macrobid 100mg  BID. To be continued after discharge for a total of 7 days  Travas Schexnayder, CNM, MSN 04/04/2014. 6:06 PM

## 2014-04-04 NOTE — MAU Note (Signed)
Report called to [facility] in [facility]. FM strip reviewed and ok to transport pt.

## 2014-04-04 NOTE — Anesthesia Procedure Notes (Signed)
Epidural Patient location during procedure: OB Start time: 04/04/2014 1:27 PM  Staffing Anesthesiologist: Rudean Curt Performed by: anesthesiologist   Preanesthetic Checklist Completed: patient identified, site marked, surgical consent, pre-op evaluation, timeout performed, IV checked, risks and benefits discussed and monitors and equipment checked  Epidural Patient position: sitting Prep: site prepped and draped and DuraPrep Patient monitoring: continuous pulse ox and blood pressure Approach: midline Location: L3-L4 Injection technique: LOR air  Needle:  Needle type: Tuohy  Needle gauge: 17 G Needle length: 9 cm and 9 Needle insertion depth: 5 cm cm Catheter type: closed end flexible Catheter size: 19 Gauge Catheter at skin depth: 10 cm Test dose: negative  Assessment Events: blood not aspirated, injection not painful, no injection resistance, negative IV test and no paresthesia  Additional Notes Patient identified.  Risk benefits discussed including failed block, incomplete pain control, headache, nerve damage, paralysis, blood pressure changes, nausea, vomiting, reactions to medication both toxic or allergic, and postpartum back pain.  Patient expressed understanding and wished to proceed.  All questions were answered.  Sterile technique used throughout procedure and epidural site dressed with sterile barrier dressing. No paresthesia or other complications noted.The patient did not experience any signs of intravascular injection such as tinnitus or metallic taste in mouth nor signs of intrathecal spread such as rapid motor block. Please see nursing notes for vital signs.

## 2014-04-04 NOTE — H&P (Signed)
Shelly Bradley is a 28 y.o. female, G3P0020 at 35.3 weeks, presenting for PPROM.  Patient reports that her water broke at 0400 and contractions began upon arrival at the hospital.  Patient denies VB and reports active fetus.  Patient GBS negative and would like to consider all non-pharmacologic and pharmacological methods of pain management.    Patient Active Problem List   Diagnosis Date Noted  . Pelvic pain in female 05/05/2012  . Fibroids 05/05/2012    History of present pregnancy: Patient entered care at 5.5 weeks.   EDC of 05/06/2014 was established by Definite LMP of 07/30/2013 and confirmed by [place] at 5.5 weeks .   Anatomy scan:  19.4 weeks, with normal, but limited findings and an anterior placenta.   Additional [place] evaluations: -6.5wks: IUP, +FHTs, Anterverted Uterus, Embryo and YS seen, Normal Fluid, Fundal  Fibroid of subserosal (3x3c3.7) CRL consistent with GA by LMP -9wks: Viability: FhR 156 -23.4wks: Anatomy: Incomplete as Face, Ductal Arch not visualized.  AFI Decreased, EFW 1lb4oz (25%), CL 3.5cm -27.1wks: Anatomy: All anatomy visualized and normal, Vertex, Normal Fluid, BPP 8/8, Dopplers Normal, Growth 5% -27.4wks: BPP and UAD for IUGR: BPP 8/8, AFI 14.9cm, CL 3.78, UAD NL with S/D -28.5wks: Growth [place]: EFW 1035g, AFI 15.6, AC 5.5cm, BPD 6.54cm, BPP 8/8, UAD Normal -29.6wks: BPOP 8/8, Dopplers Normal -31wks: Growth: EFW 3lb1oz (5.6%), NL Fluid & Dopplers, BPP 8/8, CL 3.6cm -33wks: EFW: 1480g (22%), AFI 9cm-Low Normal, Normal UAD (S/D 2.27) -34.5wks: Growth [place]: EFW 1919g (3.4%), UAD 50%, 2.56, AFI 12.23cm, BPP 10/10 Significant prenatal events:  1st Trimester: Bleeding at 5.5wks.  Started on progesterone. 2nd Trimester: Elevated A1C. C/O urinary frequency-UC negative.  Early 18wk GTT missed and 23 wk elevated.  C/O bleeding after sex. 3rd Trimester: 3hr GTT normal, Poor weight gain noted, MFM consult for IUGR-Recommend delivery at 38-39wks if remains uncomplicated.  C/O BH  contractions and increased discharge Last evaluation:  03/30/2014 by L. Eulas Post, FNP---1/40/-3   OB History    Gravida Para Term Preterm AB TAB SAB Ectopic Multiple Living   3 0 0 0 2 0 2 0 0 0       Past Medical History  Diagnosis Date  . Asthma   . Abnormal pap 2004  . Fibroid    Past Surgical History  Procedure Laterality Date  . Cervical biopsy  w/ loop electrode excision  2004   Family History: family history includes Diabetes in her maternal aunt and maternal grandmother. Social History:  reports that she has never smoked. She has never used smokeless tobacco. She reports that she drinks alcohol. She reports that she does not use illicit drugs. Patient is and African American single and works as a Designer, multimedia. She has completed post-graduate work and is [place].  FOB involvement not assessed.   Prenatal Transfer Tool  Maternal Diabetes: No, Elevated A1C, 1hr GTT, but normal 3hr GTT Genetic Screening: Normal Maternal Ultrasounds/Referrals: Abnormal:  Findings:   IUGR Fetal Ultrasounds or other Referrals:  Referred to Materal Fetal Medicine  Maternal Substance Abuse:  No Significant Maternal Medications:  Meds include: Other: Claritin, PNV Significant Maternal Lab Results: Lab values include: Group B Strep negative    ROS:  See HPI Above  No Known Allergies     Blood pressure 115/79, pulse 88, temperature 98.4 F (36.9 C), temperature source Oral, resp. rate 18, height 5\' 1"  (1.549 m), weight 161 lb 9.6 oz (73.301 kg), last menstrual period 07/30/2013.  Chest clear Heart RRR without murmur Abd gravid,  NT Pelvic: 2/70/-2, Vertex, Bloody Show Ext: WNL  FHR: 140 bpm, Mod Var, -Decels, +Accels  UCs:  Q4-2min, palpates moderate  Prenatal labs: ABO, Rh:  A Positive Antibody:  Negative Rubella:   Immune RPR:   Negative HBsAg:   Negative HIV:   Negative GBS:  Negative Sickle cell/Hgb electrophoresis:  Normal  Pap:  2013 GC:  Negative Chlamydia:   Negative Genetic screenings:  Negative Glucola:  Abnormal 1hr, Normal 3 hr Other:  A1C-6.1, Progesterone 7.9, TSH Normal   Assessment IUP at 35.3wks Cat I FT GBS Negative PPROM IUGR  Plan: Consulted with Neonatologist regarding patient and infant status Discussed potential of infant transfer depending on status upon delivery, patient verbalized understanding-opts for admission Admit to [facility] per consult with Dr. Delane Ginger. [place] Routine Labor and Delivery Orders per [facility] Protocol Will report to V. Standard, CNM  Seaver Machia [id number], MSN 04/04/2014, 7:05 AM

## 2014-04-04 NOTE — Progress Notes (Signed)
Labor Progress  Subjective: Breathing thru each ctx.  Wants to manage the pain with IV pain medication, she does not want an epidural at this time.  Pitocin was reviewed, pt is scared of the increase in pain but has agree.  NICU admission was reviewed. Pt understands with a LBW/IUGR preterm baby its a great possibility that the baby will need more than just NICU evaluation, she may need to be admitted to the NICU for care.  The pt also understands that if the baby needs NICU level care she will be transferred to [facility]  Objective: BP 146/72 mmHg  Pulse 91  Temp(Src) 97.9 F (36.6 C) (Oral)  Resp 18  Ht 5\' 1"  (1.549 m)  Wt 73.029 kg (161 lb)  BMI 30.44 kg/m2  SpO2 100%  LMP 07/30/2013     FHT: 120, + accel, no decel, moderate variability CTX:  regular, every 4-5 minutes Uterus gravid, soft non tender SVE:  Dilation: 3.5 Effacement (%): 70 Station: -2 Exam by:: Deboraha Sprang RNC  Assessment:  IUP at 35.3 weeks NICHD: Category Membranes:  SROM x 11hrs, no s/s of infection Labor progress: early labor [id number]  Plan: Continue labor plan Continuous monitoring Rest/Ambulate per pt desire  Frequent position changes to facilitate fetal rotation and descent. Will reassess with cervical exam at 1500 or earlier if necessary Start pitocin augmentation per protocol.     Sherlynn Tourville, CNM, MSN 04/04/2014. 11:18 AM

## 2014-04-04 NOTE — Plan of Care (Signed)
Problem: Phase II Progression Outcomes Goal: Pain controlled on oral analgesia Outcome: Completed/Met Date Met:  04/04/14 Goal: Progress activity as tolerated unless otherwise ordered Outcome: Completed/Met Date Met:  04/04/14 Goal: Afebrile, VS remain stable Outcome: Completed/Met Date Met:  04/04/14 Goal: Incision intact & without signs/symptoms of infection Outcome: Completed/Met Date Met:  04/04/14 Goal: Rh isoimmunization per orders Outcome: Not Applicable Date Met:  42/87/68 Goal: Tolerating diet Outcome: Completed/Met Date Met:  04/04/14 Goal: Other Phase II Outcomes/Goals Outcome: Not Applicable Date Met:  11/57/26

## 2014-04-04 NOTE — Progress Notes (Signed)
V. Standard, CNM notified of symptoms of pre-delivery urinary frequency and burning. Order to collect a urinalysis.

## 2014-04-05 LAB — CBC
HCT: 26.8 % — ABNORMAL LOW (ref 36.0–46.0)
Hemoglobin: 8.7 g/dL — ABNORMAL LOW (ref 12.0–15.0)
MCH: 23.7 pg — ABNORMAL LOW (ref 26.0–34.0)
MCHC: 32.5 g/dL (ref 30.0–36.0)
MCV: 73 fL — ABNORMAL LOW (ref 78.0–100.0)
PLATELETS: 170 10*3/uL (ref 150–400)
RBC: 3.67 MIL/uL — ABNORMAL LOW (ref 3.87–5.11)
RDW: 15.4 % (ref 11.5–15.5)
WBC: 13 10*3/uL — AB (ref 4.0–10.5)

## 2014-04-05 LAB — RPR

## 2014-04-05 NOTE — Lactation Note (Signed)
This note was copied from the chart of Shelly Bradley. Lactation Consultation Note    Initial consult with this mom of a NICU baby, now 51 hours old, and 35 4/7 weeks CGA. Mom has been pumping and expressing very good amounts of colostrum - up to 15 mls. I reviewed hand expression with mom, and how to set premie setting, pump part care, kangaroo care, self care and the NICU booklet on providing EBm for the NICU baby. Mom in process of getting Medicaid, so I faxed mom's  Patient Name: Shelly Bradley [id number] Date: 04/05/2014 Reason for consult: Initial assessment;NICU baby;Late preterm infant   Maternal Data Formula Feeding for Exclusion: Yes (baby in NICU) Has patient been taught Hand Expression?: Yes Does the patient have breastfeeding experience prior to this delivery?: No  Feeding Feeding Type: Breast Milk Length of feed: 30 min  LATCH Score/Interventions                      Lactation Tools Discussed/Used WIC Program: No (mom needs to apply - in process of getting Medicaid, info faxed to [facility] for mom and baby) Pump Review: Setup, frequency, and cleaning;Milk Storage;Other (comment) Initiated by:: bedside rn Date initiated:: 04/04/14   Consult Status Date: 04/06/14 Follow-up type: In-patient    Tonna Corner 04/05/2014, 11:19 AM

## 2014-04-05 NOTE — Plan of Care (Signed)
Problem: Discharge Progression Outcomes Goal: MMR given as ordered Outcome: Not Applicable Date Met:  37/19/07

## 2014-04-05 NOTE — Discharge Summary (Signed)
Vaginal Delivery Discharge Summary  ALL information will be verified prior to discharge  Shelly Bradley  DOB:    [birth year] MRN:    [id number] CSN:    [id number]  Date of admission:                  04/04/14  Date of discharge:                   04/06/14  Procedures this admission: SVD by Dio Giller CNM  Date of Delivery: 04/04/14  Newborn Data:  Live born  Information for the patient's newborn:  Ayeisha, Lindenberger [[id number]]  female Live born female  Birth Weight: 4 lb 3 oz (1900 g) APGAR: 8, 8  Baby will remain in NICU. Name: Glenard Haring   History of Present Illness: [name] is a 28 y.o. female, [phone], who presents at [redacted]w[redacted]d weeks gestation. The patient has been followed at the [facility] and Gynecology division of [facility]. She was admitted spontaneous abortion. Her pregnancy has been complicated by:  Patient Active Problem List   Diagnosis Date Noted  . Premature delivery before 37 weeks 04/05/2014  . Preterm premature rupture of membranes (PPROM) with onset of labor within 24 hours of rupture in third trimester, antepartum 04/04/2014  . Vaginal delivery 04/04/2014  . Pelvic pain in female 05/05/2012  . Fibroids 05/05/2012    Hospital course: The patient was admitted for PPROM.   Her labor was complicated by PPROM and IUGR. She proceeded to have a vaginal delivery of a healthy infant. Her delivery was not complicated. Her postpartum course was not complicated. She was discharged to home on postpartum day 2 doing well.  Feeding: Breast pumping  Contraception: condoms   Discharge hemoglobin: HEMOGLOBIN  Date Value Ref Range Status  04/05/2014 8.7* 12.0 - 15.0 g/dL Final   HCT  Date Value Ref Range Status  04/05/2014 26.8* 36.0 - 46.0 % Final  anemia - hemodynamically stable  PreNatal Labs ABO, Rh: --/--/A POS (11/08 5597)   Antibody: NEG (11/08 0655) Rubella:   immune RPR: NON REAC (11/08 0655)  HBsAg:  Negative (04/14 0000)  HIV: Non-reactive (04/14 0000)  GBS: Negative (11/03 0000)  Discharge Physical Exam:  General: alert and cooperative Lochia: appropriate Uterine Fundus: firm Incision: healing well, 1st degree perineal laceration, superficial left labial, and right periurethral laceration DVT Evaluation: No evidence of DVT seen on physical exam.  Intrapartum Procedures: spontaneous vaginal delivery Postpartum Procedures: none Complications-Operative and Postpartum:   Discharge Diagnoses: Preterm Delivery, IUGR infant, anemia  Activity:           pelvic rest Diet:                routine Medications: PNV, Ibuprofen, Iron and Percocet Condition:      stable     Postpartum Teaching: Nutrition, exercise, return to work or school, family visits, sexual activity, home rest, vaginal bleeding, pelvic rest, family planning, s/s of PPD, breast care peri-care and incision care   Discharge to: home, infant to remain in NICU  Follow-up Information    Follow up with [facility]. Schedule an appointment as soon as possible for a visit in 6 weeks.   Specialty:  Obstetrics and Gynecology   Why:  Postpartum check up   Contact information:   [address] [id number] [place], CNM, MSN 04/05/2014. 11:43 PM  Postpartum Care After Vaginal Delivery  After you deliver your newborn (postpartum period), the usual stay in the hospital is 24 72 hours. If there were problems with your labor or delivery, or if you have other medical problems, you might be in the hospital longer.  While you are in the hospital, you will receive help and instructions on how to care for yourself and your newborn during the postpartum period.  While you are in the hospital:  Be sure to tell your nurses if you have pain or discomfort, as well as where you feel the pain and what makes the pain worse.  If you had an  incision made near your vagina (episiotomy) or if you had some tearing during delivery, the nurses may put ice packs on your episiotomy or tear. The ice packs may help to reduce the pain and swelling.  If you are breastfeeding, you may feel uncomfortable contractions of your uterus for a couple of weeks. This is normal. The contractions help your uterus get back to normal size.  It is normal to have some bleeding after delivery.  For the first 1 3 days after delivery, the flow is red and the amount may be similar to a period.  It is common for the flow to start and stop.  In the first few days, you may pass some small clots. Let your nurses know if you begin to pass large clots or your flow increases.  Do not  flush blood clots down the toilet before having the nurse look at them.  During the next 3 10 days after delivery, your flow should become more watery and pink or brown-tinged in color.  Ten to fourteen days after delivery, your flow should be a small amount of yellowish-white discharge.  The amount of your flow will decrease over the first few weeks after delivery. Your flow may stop in 6 8 weeks. Most women have had their flow stop by 12 weeks after delivery.  You should change your sanitary pads frequently.  Wash your hands thoroughly with soap and water for at least 20 seconds after changing pads, using the toilet, or before holding or feeding your newborn.  You should feel like you need to empty your bladder within the first 6 8 hours after delivery.  In case you become weak, lightheaded, or faint, call your nurse before you get out of bed for the first time and before you take a shower for the first time.  Within the first few days after delivery, your breasts may begin to feel tender and full. This is called engorgement. Breast tenderness usually goes away within 48 72 hours after engorgement occurs. You may also notice milk leaking from your breasts. If you are not  breastfeeding, do not stimulate your breasts. Breast stimulation can make your breasts produce more milk.  Spending as much time as possible with your newborn is very important. During this time, you and your newborn can feel close and get to know each other. Having your newborn stay in your room (rooming in) will help to strengthen the bond with your newborn. It will give you time to get to know your newborn and become comfortable caring for your newborn.  Your hormones change after delivery. Sometimes the hormone changes can temporarily cause you to feel sad or tearful. These feelings should not last more than a few days. If these feelings last longer than that, you should talk to your caregiver.  If desired, talk to your  caregiver about methods of family planning or contraception.  Talk to your caregiver about immunizations. Your caregiver may want you to have the following immunizations before leaving the hospital:  Tetanus, diphtheria, and pertussis (Tdap) or tetanus and diphtheria (Td) immunization. It is very important that you and your family (including grandparents) or others caring for your newborn are up-to-date with the Tdap or Td immunizations. The Tdap or Td immunization can help protect your newborn from getting ill.  Rubella immunization.  Varicella (chickenpox) immunization.  Influenza immunization. You should receive this annual immunization if you did not receive the immunization during your pregnancy. Document Released: 03/11/2007 Document Revised: 02/06/2012 Document Reviewed: 01/09/2012 [facility] Patient Information 2014 [place].   Postpartum Depression and Baby Blues  The postpartum period begins right after the birth of a baby. During this time, there is often a great amount of joy and excitement. It is also a time of considerable changes in the life of the parent(s). Regardless of how many times a mother gives birth, each child brings new challenges and  dynamics to the family. It is not unusual to have feelings of excitement accompanied by confusing shifts in moods, emotions, and thoughts. All mothers are at risk of developing postpartum depression or the "baby blues." These mood changes can occur right after giving birth, or they may occur many months after giving birth. The baby blues or postpartum depression can be mild or severe. Additionally, postpartum depression can resolve rather quickly, or it can be a long-term condition. CAUSES Elevated hormones and their rapid decline are thought to be a main cause of postpartum depression and the baby blues. There are a number of hormones that radically change during and after pregnancy. Estrogen and progesterone usually decrease immediately after delivering your baby. The level of thyroid hormone and various cortisol steroids also rapidly drop. Other factors that play a major role in these changes include major life events and genetics.  RISK FACTORS If you have any of the following risks for the baby blues or postpartum depression, know what symptoms to watch out for during the postpartum period. Risk factors that may increase the likelihood of getting the baby blues or postpartum depression include: 1. Havinga personal or family history of depression. 2. Having depression while being pregnant. 3. Having premenstrual or oral contraceptive-associated mood issues. 4. Having exceptional life stress. 5. Having marital conflict. 6. Lacking a social support network. 7. Having a baby with special needs. 8. Having health problems such as diabetes. SYMPTOMS Baby blues symptoms include:  Brief fluctuations in mood, such as going from extreme happiness to sadness.  Decreased concentration.  Difficulty sleeping.  Crying spells, tearfulness.  Irritability.  Anxiety. Postpartum depression symptoms typically begin within the first month after giving birth. These symptoms include:  Difficulty sleeping  or excessive sleepiness.  Marked weight loss.  Agitation.  Feelings of worthlessness.  Lack of interest in activity or food. Postpartum psychosis is a very concerning condition and can be dangerous. Fortunately, it is rare. Displaying any of the following symptoms is cause for immediate medical attention. Postpartum psychosis symptoms include:  Hallucinations and delusions.  Bizarre or disorganized behavior.  Confusion or disorientation. DIAGNOSIS  A diagnosis is made by an evaluation of your symptoms. There are no medical or lab tests that lead to a diagnosis, but there are various questionnaires that a caregiver may use to identify those with the baby blues, postpartum depression, or psychosis. Often times, a screening tool called the [place] Postnatal Depression Scale  is used to diagnose depression in the postpartum period.  TREATMENT The baby blues usually goes away on its own in 1 to 2 weeks. Social support is often all that is needed. You should be encouraged to get adequate sleep and rest. Occasionally, you may be given medicines to help you sleep.  Postpartum depression requires treatment as it can last several months or longer if it is not treated. Treatment may include individual or group therapy, medicine, or both to address any social, physiological, and psychological factors that may play a role in the depression. Regular exercise, a healthy diet, rest, and social support may also be strongly recommended.  Postpartum psychosis is more serious and needs treatment right away. Hospitalization is often needed. HOME CARE INSTRUCTIONS  Get as much rest as you can. Nap when the baby sleeps.  Exercise regularly. Some women find yoga and walking to be beneficial.  Eat a balanced and nourishing diet.  Do little things that you enjoy. Have a cup of tea, take a bubble bath, read your favorite magazine, or listen to your favorite music.  Avoid alcohol.  Ask for help with household  chores, cooking, grocery shopping, or running errands as needed. Do not try to do everything.  Talk to people close to you about how you are feeling. Get support from your partner, family members, friends, or other new moms.  Try to stay positive in how you think. Think about the things you are grateful for.  Do not spend a lot of time alone.  Only take medicine as directed by your caregiver.  Keep all your postpartum appointments.  Let your caregiver know if you have any concerns. SEEK MEDICAL CARE IF: You are having a reaction or problems with your medicine. SEEK IMMEDIATE MEDICAL CARE IF:  You have suicidal feelings.  You feel you may harm the baby or someone else. Document Released: 02/16/2004 Document Revised: 08/06/2011 Document Reviewed: 03/20/2011 [facility] Patient Information 2014 [place], [place].     Breastfeeding Deciding to breastfeed is one of the best choices you can make for you and your baby. A change in hormones during pregnancy causes your breast tissue to grow and increases the number and size of your milk ducts. These hormones also allow proteins, sugars, and fats from your blood supply to make breast milk in your milk-producing glands. Hormones prevent breast milk from being released before your baby is born as well as prompt milk flow after birth. Once breastfeeding has begun, thoughts of your baby, as well as his or her sucking or crying, can stimulate the release of milk from your milk-producing glands.  BENEFITS OF BREASTFEEDING For Your Baby  Your first milk (colostrum) helps your baby's digestive system function better.   There are antibodies in your milk that help your baby fight off infections.   Your baby has a lower incidence of asthma, allergies, and sudden infant death syndrome.   The nutrients in breast milk are better for your baby than infant formulas and are designed uniquely for your baby's needs.   Breast milk improves your baby's brain  development.   Your baby is less likely to develop other conditions, such as childhood obesity, asthma, or type 2 diabetes mellitus.  For You   Breastfeeding helps to create a very special bond between you and your baby.   Breastfeeding is convenient. Breast milk is always available at the correct temperature and costs nothing.   Breastfeeding helps to burn calories and helps you lose the weight  gained during pregnancy.   Breastfeeding makes your uterus contract to its prepregnancy size faster and slows bleeding (lochia) after you give birth.   Breastfeeding helps to lower your risk of developing type 2 diabetes mellitus, osteoporosis, and breast or ovarian cancer later in life. SIGNS THAT YOUR BABY IS HUNGRY Early Signs of Hunger  Increased alertness or activity.  Stretching.  Movement of the head from side to side.  Movement of the head and opening of the mouth when the corner of the mouth or cheek is stroked (rooting).  Increased sucking sounds, smacking lips, cooing, sighing, or squeaking.  Hand-to-mouth movements.  Increased sucking of fingers or hands. Late Signs of Hunger  Fussing.  Intermittent crying. Extreme Signs of Hunger Signs of extreme hunger will require calming and consoling before your baby will be able to breastfeed successfully. Do not wait for the following signs of extreme hunger to occur before you initiate breastfeeding:   Restlessness.  A loud, strong cry.   Screaming.   BREASTFEEDING BASICS Breastfeeding Initiation  Find a comfortable place to sit or lie down, with your neck and back well supported.  Place a pillow or rolled up blanket under your baby to bring him or her to the level of your breast (if you are seated). Nursing pillows are specially designed to help support your arms and your baby while you breastfeed.  Make sure that your baby's abdomen is facing your abdomen.   Gently massage your breast. With your fingertips,  massage from your chest wall toward your nipple in a circular motion. This encourages milk flow. You may need to continue this action during the feeding if your milk flows slowly.  Support your breast with 4 fingers underneath and your thumb above your nipple. Make sure your fingers are well away from your nipple and your baby's mouth.   Stroke your baby's lips gently with your finger or nipple.   When your baby's mouth is open wide enough, quickly bring your baby to your breast, placing your entire nipple and as much of the colored area around your nipple (areola) as possible into your baby's mouth.   More areola should be visible above your baby's upper lip than below the lower lip.   Your baby's tongue should be between his or her lower gum and your breast.   Ensure that your baby's mouth is correctly positioned around your nipple (latched). Your baby's lips should create a seal on your breast and be turned out (everted).  It is common for your baby to suck about 2-3 minutes in order to start the flow of breast milk. Latching Teaching your baby how to latch on to your breast properly is very important. An improper latch can cause nipple pain and decreased milk supply for you and poor weight gain in your baby. Also, if your baby is not latched onto your nipple properly, he or she may swallow some air during feeding. This can make your baby fussy. Burping your baby when you switch breasts during the feeding can help to get rid of the air. However, teaching your baby to latch on properly is still the best way to prevent fussiness from swallowing air while breastfeeding. Signs that your baby has successfully latched on to your nipple:    Silent tugging or silent sucking, without causing you pain.   Swallowing heard between every 3-4 sucks.    Muscle movement above and in front of his or her ears while sucking.  Signs that your  baby has not successfully latched on to nipple:    Sucking sounds or smacking sounds from your baby while breastfeeding.  Nipple pain. If you think your baby has not latched on correctly, slip your finger into the corner of your baby's mouth to break the suction and place it between your baby's gums. Attempt breastfeeding initiation again. Signs of Successful Breastfeeding Signs from your baby:   A gradual decrease in the number of sucks or complete cessation of sucking.   Falling asleep.   Relaxation of his or her body.   Retention of a small amount of milk in his or her mouth.   Letting go of your breast by himself or herself. Signs from you:  Breasts that have increased in firmness, weight, and size 1-3 hours after feeding.   Breasts that are softer immediately after breastfeeding.  Increased milk volume, as well as a change in milk consistency and color by the fifth day of breastfeeding.   Nipples that are not sore, cracked, or bleeding. Signs That Your [place] [employer] is Getting Enough Milk  Wetting at least 3 diapers in a 24-hour period. The urine should be clear and pale yellow by age 373 days.  At least 3 stools in a 24-hour period by age 373 days. The stool should be soft and yellow.  At least 3 stools in a 24-hour period by age 51 days. The stool should be seedy and yellow.  No loss of weight greater than 10% of birth weight during the first 72 days of age.  Average weight gain of 4-7 ounces (113-198 g) per week after age 57 days.  Consistent daily weight gain by age 29 days, without weight loss after the age of 2 weeks. After a feeding, your baby may spit up a small amount. This is common. BREASTFEEDING FREQUENCY AND DURATION Frequent feeding will help you make more milk and can prevent sore nipples and breast engorgement. Breastfeed when you feel the need to reduce the fullness of your breasts or when your baby shows signs of hunger. This is called "breastfeeding on demand." Avoid introducing a pacifier to your baby while  you are working to establish breastfeeding (the first 4-6 weeks after your baby is born). After this time you may choose to use a pacifier. Research has shown that pacifier use during the first year of a baby's life decreases the risk of sudden infant death syndrome (SIDS). Allow your baby to feed on each breast as long as he or she wants. Breastfeed until your baby is finished feeding. When your baby unlatches or falls asleep while feeding from the first breast, offer the second breast. Because newborns are often sleepy in the first few weeks of life, you may need to awaken your baby to get him or her to feed. Breastfeeding times will vary from baby to baby. However, the following rules can serve as a guide to help you ensure that your baby is properly fed:  Newborns (babies 47 weeks of age or younger) may breastfeed every 1-3 hours.  Newborns should not go longer than 3 hours during the day or 5 hours during the night without breastfeeding.  You should breastfeed your baby a minimum of 8 times in a 24-hour period until you begin to introduce solid foods to your baby at around 76 months of age. BREAST MILK PUMPING Pumping and storing breast milk allows you to ensure that your baby is exclusively fed your breast milk, even at times when you are unable to  breastfeed. This is especially important if you are going back to work while you are still breastfeeding or when you are not able to be present during feedings. Your lactation consultant can give you guidelines on how long it is safe to store breast milk.  A breast pump is a machine that allows you to pump milk from your breast into a sterile bottle. The pumped breast milk can then be stored in a refrigerator or freezer. Some breast pumps are operated by hand, while others use electricity. Ask your lactation consultant which type will work best for you. Breast pumps can be purchased, but some hospitals and breastfeeding support groups lease breast pumps on  a monthly basis. A lactation consultant can teach you how to hand express breast milk, if you prefer not to use a pump.  CARING FOR YOUR BREASTS WHILE YOU BREASTFEED Nipples can become dry, cracked, and sore while breastfeeding. The following recommendations can help keep your breasts moisturized and healthy:  Avoid using soap on your nipples.   Wear a supportive bra. Although not required, special nursing bras and tank tops are designed to allow access to your breasts for breastfeeding without taking off your entire bra or top. Avoid wearing underwire-style bras or extremely tight bras.  Air dry your nipples for 3-63minutes after each feeding.   Use only cotton bra pads to absorb leaked breast milk. Leaking of breast milk between feedings is normal.   Use lanolin on your nipples after breastfeeding. Lanolin helps to maintain your skin's normal moisture barrier. If you use pure lanolin, you do not need to wash it off before feeding your baby again. Pure lanolin is not toxic to your baby. You may also hand express a few drops of breast milk and gently massage that milk into your nipples and allow the milk to air dry. In the first few weeks after giving birth, some women experience extremely full breasts (engorgement). Engorgement can make your breasts feel heavy, warm, and tender to the touch. Engorgement peaks within 3-5 days after you give birth. The following recommendations can help ease engorgement:  Completely empty your breasts while breastfeeding or pumping. You may want to start by applying warm, moist heat (in the shower or with warm water-soaked hand towels) just before feeding or pumping. This increases circulation and helps the milk flow. If your baby does not completely empty your breasts while breastfeeding, pump any extra milk after he or she is finished.  Wear a snug bra (nursing or regular) or tank top for 1-2 days to signal your body to slightly decrease milk  production.  Apply ice packs to your breasts, unless this is too uncomfortable for you.  Make sure that your baby is latched on and positioned properly while breastfeeding. If engorgement persists after 48 hours of following these recommendations, contact your health care provider or a Science writer. OVERALL HEALTH CARE RECOMMENDATIONS WHILE BREASTFEEDING  Eat healthy foods. Alternate between meals and snacks, eating 3 of each per day. Because what you eat affects your breast milk, some of the foods may make your baby more irritable than usual. Avoid eating these foods if you are sure that they are negatively affecting your baby.  Drink milk, fruit juice, and water to satisfy your thirst (about 10 glasses a day).   Rest often, relax, and continue to take your prenatal vitamins to prevent fatigue, stress, and anemia.  Continue breast self-awareness checks.  Avoid chewing and smoking tobacco.  Avoid alcohol and drug use.  Some medicines that may be harmful to your baby can pass through breast milk. It is important to ask your health care provider before taking any medicine, including all over-the-counter and prescription medicine as well as vitamin and herbal supplements. It is possible to become pregnant while breastfeeding. If birth control is desired, ask your health care provider about options that will be safe for your baby. SEEK MEDICAL CARE IF:   You feel like you want to stop breastfeeding or have become frustrated with breastfeeding.  You have painful breasts or nipples.  Your nipples are cracked or bleeding.  Your breasts are red, tender, or warm.  You have a swollen area on either breast.  You have a fever or chills.  You have nausea or vomiting.  You have drainage other than breast milk from your nipples.  Your breasts do not become full before feedings by the fifth day after you give birth.  You feel sad and depressed.  Your baby is too sleepy to eat  well.  Your baby is having trouble sleeping.   Your baby is wetting less than 3 diapers in a 24-hour period.  Your baby has less than 3 stools in a 24-hour period.  Your baby's skin or the white part of his or her eyes becomes yellow.   Your baby is not gaining weight by 44 days of age. SEEK IMMEDIATE MEDICAL CARE IF:   Your baby is overly tired (lethargic) and does not want to wake up and feed.  Your baby develops an unexplained fever. Document Released: 05/14/2005 Document Revised: 05/19/2013 Document Reviewed: 11/05/2012 [facility] Patient Information 2015 [place], [place]. This information is not intended to replace advice given to you by your health care provider. Make sure you discuss any questions you have with your health care provider.

## 2014-04-05 NOTE — Anesthesia Postprocedure Evaluation (Signed)
Anesthesia Post Note  Patient: Shelly Bradley  Procedure(s) Performed: * No procedures listed *  Anesthesia type: Epidural  Patient location: Mother/Baby  Post pain: Pain level controlled  Post assessment: Post-op Vital signs reviewed  Last Vitals:  Filed Vitals:   04/05/14 0518  BP: 107/67  Pulse: 66  Temp: 37.1 C  Resp: 18    Post vital signs: Reviewed  Level of consciousness:alert  Complications: No apparent anesthesia complications

## 2014-04-05 NOTE — Progress Notes (Signed)
Subjective: Postpartum Day 1: Vaginal delivery, 1st degree perineal laceration, superficial left labial, and right periurethral laceration Baby stable in NICU--on room air, doing well. Patient up ad lib, reports no syncope or dizziness. Feeding:  Pumping Contraceptive plan:  Undecided  Objective: Vital signs in last 24 hours: Temp:  [97.8 F (36.6 C)-99 F (37.2 C)] 98.7 F (37.1 C) (11/09 0518) Pulse Rate:  [61-109] 66 (11/09 0518) Resp:  [16-20] 18 (11/09 0518) BP: (70-146)/(54-80) 107/67 mmHg (11/09 0518) SpO2:  [99 %-100 %] 100 % (11/09 0518)   Orthostatics stable.   Physical Exam:  General: alert Lochia: appropriate Uterine Fundus: firm Perineum: Healing well DVT Evaluation: No evidence of DVT seen on physical exam. Negative Homan's sign.    Recent Labs  04/04/14 0655 04/05/14 0510  HGB 9.4* 8.7*  HCT 29.5* 26.8*    Assessment/Plan: Status post vaginal delivery day 1. S/p PROM, PTD, IUGR Anemia without hemodynamic compromise Stable Continue current care. Fe BID Declines transfusion Plan for discharge tomorrow--reviewed emotional issue of d/c home without baby.    Trammell Bowden, [name] 04/05/2014, 7:59 AM

## 2014-04-05 NOTE — Progress Notes (Signed)
Ur chart review completed.  

## 2014-04-05 NOTE — Progress Notes (Signed)
Clinical Social Work Department PSYCHOSOCIAL ASSESSMENT - MATERNAL/CHILD 04/05/2014  Patient:  Shelly Bradley, Shelly Bradley  Account Number:  [id number]  Admit Date:  04/04/2014  Shelly Bradley Name:   Shelly Bradley    Clinical Social Worker:  Shelly Piedra, LCSW   Date/Time:  04/05/2014 02:30 PM  Date Referred:        Other referral source:   No referral-NICU admission    I:  FAMILY / [place] legal guardian:  Shelly Bradley - Name Guardian - Age Guardian - Address  Shelly Bradley [address] [place] [place], [place] [address]  Shelly Bradley     Other household support members/support persons Other support:   MOB states she has a lot of family support.  She states her mother and FOB are her main support people.  Her family lives in [place], [place].  FOB's family lives in [place] (approximately 2hrs away).    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  FOB works at [employer].   Financial resources:  Multimedia programmer If [place]:    School / Grade:   Maternity Care Coordinator / Child Services Coordination / Early Interventions:  Cultural issues impacting care:   None stated.  Chart specifically notes that MOB is a [place].    III  STRENGTHS Strengths  Adequate Resources  Compliance with medical plan  Home prepared for Child (including basic supplies)  Other - See comment  Supportive family/friends  Understanding of illness   Strength comment:  CSW informed MOB that she can obtain a pediatrician list from the nurses station in the NICU.   IV  RISK FACTORS AND CURRENT PROBLEMS Current Problem:  None   Risk Factor & Current Problem Patient Issue Family Issue Risk Factor / Current Problem Comment   N N     V  SOCIAL WORK ASSESSMENT  CSW met with MOB in her third floor room/305 to introduce myself, offer support and complete  assessment due to NICU admission of baby at 35.3 weeks.  MOB was resting, but welcomed CSW into the room.  CSW offered to return at a later time, but MOB stated that CSW could speak with her now.  When CSW asked how she is doing at this time, she replied, "I'm doing well, I just need sleep."  CSW's assessment was therefore brief so MOB could rest.  MOB was extremely pleasant and very excited about the baby.  She seemed thrilled that the baby had come early so that she could meet her sooner.  She is understanding of baby's need for ICU care.  CSW discussed, in general terms, what to expect from a NICU admission and encouragement on how to stay positive.  MOB was very attentive and understanding.  She seems to be in great spirits at this time.  She reports no issues with transportation after her discharge.  She reports having a great support system and everything she needs for baby at home.  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  She states she will not have to return to work until some time in January.  MOB thanked CSW very much for coming to meet with her and states no questions, concerns or needs at this time.  CSW provided contact information and asked MOB to call anytime.  MOB smiled  and agreed.   VI SOCIAL WORK PLAN Social Work Plan  Psychosocial Support/Ongoing Assessment of Needs  Patient/Family Education   Type of pt/family education:   Ongoing support services offered by NICU CSW  What to expect from a NICU admission   If child protective services report - county:   If child protective services report - date:   Information/referral to community resources comment:   no referral needs noted at this time.   Other social work plan:

## 2014-04-06 ENCOUNTER — Other Ambulatory Visit (HOSPITAL_COMMUNITY): Payer: Self-pay

## 2014-04-06 ENCOUNTER — Ambulatory Visit: Payer: Self-pay

## 2014-04-06 LAB — URINE CULTURE

## 2014-04-06 MED ORDER — NITROFURANTOIN MONOHYD MACRO 100 MG PO CAPS: 100.0000 mg | ORAL_CAPSULE | Freq: Two times a day (BID) | ORAL | Status: AC

## 2014-04-06 MED ORDER — FERROUS SULFATE 325 (65 FE) MG PO TABS: 325.0000 mg | ORAL_TABLET | Freq: Two times a day (BID) | ORAL | Status: DC

## 2014-04-06 MED ORDER — PNEUMOCOCCAL VAC POLYVALENT 25 MCG/0.5ML IJ INJ
0.5000 mL | INJECTION | Freq: Once | INTRAMUSCULAR | Status: AC
  Administered 2014-04-06: 0.5 mL via INTRAMUSCULAR
  Filled 2014-04-06: qty 0.5

## 2014-04-06 MED ORDER — OXYCODONE-ACETAMINOPHEN 5-325 MG PO TABS: 1.0000 | ORAL_TABLET | ORAL | Status: DC | PRN

## 2014-04-06 NOTE — Progress Notes (Signed)
Discharge teaching complete. Pt understood all instructions and did not have any questions. Pt ambulated out of the hospital and discharged home.

## 2014-04-06 NOTE — Plan of Care (Signed)
Problem: Discharge Progression Outcomes Goal: Tolerating diet Outcome: Completed/Met Date Met:  04/06/14 Goal: Pain controlled with appropriate interventions Outcome: Completed/Met Date Met:  04/06/14 Goal: Afebrile, VS remain stable at discharge Outcome: Completed/Met Date Met:  04/06/14

## 2014-04-06 NOTE — Lactation Note (Signed)
This note was copied from the chart of Shelly Bradley. Lactation Consultation Note     Follow up consult with this mom of a NICU baby, now 61 hours old, and 35 5/7 weeks CGA. Mom is being discharged to home today, and I loaned her a DEP. Mom has an appointment with [facility] on Thursday, 11/12. Breast care, engorgement care reviewed with mom. Mom has a slightly inverted right nipple, which is getting sore from pumping.I gave mom comfort gels, and instructed her in their use. Mom doing ell with her milk supply, expressing a large amount of colostrum. Mom knows to call for lactation through her baby's nurse, after today.   Patient Name: Shelly Verdine Grenfell JHERD'E Date: 04/06/2014 Reason for consult: Follow-up assessment;NICU baby;Infant < 6lbs   Maternal Data    Feeding Feeding Type: Formula Length of feed: 45 min  LATCH Score/Interventions Latch: Repeated attempts needed to sustain latch, nipple held in mouth throughout feeding, stimulation needed to elicit sucking reflex. Intervention(s): Skin to skin Intervention(s): Adjust position  Audible Swallowing: None Intervention(s): Skin to skin  Type of Nipple: Everted at rest and after stimulation  Comfort (Breast/Nipple): Soft / non-tender     Hold (Positioning): Full assist, staff holds infant at breast Intervention(s): Support Pillows;Breastfeeding basics reviewed;Skin to skin  LATCH Score: 5  Lactation Tools Discussed/Used     Consult Status Consult Status: PRN Follow-up type: In-patient (NICU)    Tonna Corner 04/06/2014, 1:47 PM

## 2014-04-09 ENCOUNTER — Ambulatory Visit: Payer: Self-pay

## 2014-04-09 NOTE — Lactation Note (Signed)
This note was copied from the chart of Shelly Bradley. Lactation Consultation Note    Follow up consult with this mom of a NICU baby, now 52 days old, and 36 1/7 weeks CGA. Mom has been latching the baby to breast , and baby is sucking and tolerating well. Mom said she is having trouble with her pump parts working, so she is bringing them in later today, for me to look at. I gave mom a hand pump to use, and showed mom how to use it. Mom doing well with her milk supply, and I follow up with her later today.  Patient Name: Shelly Shaaron Golliday TKZSW'F Date: 04/09/2014 Reason for consult: Follow-up assessment   Maternal Data    Feeding Feeding Type: Breast Milk Nipple Type: Slow - flow (yellow) Length of feed: 60 min  LATCH Score/Interventions                      Lactation Tools Discussed/Used Tools: Pump Breast pump type: Manual   Consult Status Consult Status: PRN Follow-up type: In-patient (NICU)    Tonna Corner 04/09/2014, 1:50 PM

## 2014-04-20 ENCOUNTER — Ambulatory Visit: Payer: Self-pay

## 2014-04-20 NOTE — Lactation Note (Addendum)
This note was copied from the chart of Shelly Bradley. Lactation Consultation Note    Follow up consult with this mom of a NICU baby, now 15 weeks old, and 37 5/7 weeks CGA, weighing 4 lbs 11.1 oz. The baby was being sen by PT due to feeding issues. She reportedly "guzzles" her food, and gets uncoordinated with her suck, swallow and breathing, leading to brady's and desats. . She was sleepy at the breast today, so no milk transferred. We will try again when baby is more awake and alert.    Mom also reports she slowed sown on her pumping, and as a result is seeing a decrease in her supply. She is now 2 weeks pp. I encouraged mom to get back to pumping at least 8 times a day, to pump every 4 hours at night, but every 2-3 during the day, and to stay hydrtaed, and see how her supply responds to this.   Patient Name: Shelly Bradley OEHOZ'Y Date: 04/20/2014 Reason for consult: Follow-up assessment   Maternal Data    Feeding Feeding Type: Breast Milk Length of feed: 60 min  LATCH Score/Interventions Latch: Too sleepy or reluctant, no latch achieved, no sucking elicited. (baby latched but was too sleepy at the breast.) Intervention(s): Skin to skin;Teach feeding cues;Waking techniques Intervention(s): Adjust position;Assist with latch;Breast compression  Audible Swallowing: None Intervention(s): Skin to skin;Hand expression  Type of Nipple: Everted at rest and after stimulation  Comfort (Breast/Nipple): Soft / non-tender     Hold (Positioning): Assistance needed to correctly position infant at breast and maintain latch. Intervention(s): Breastfeeding basics reviewed;Support Pillows;Position options;Skin to skin  LATCH Score: 5  Lactation Tools Discussed/Used     Consult Status Consult Status: Follow-up Follow-up type: In-patient (NICU)    Tonna Corner 04/20/2014, 3:38 PM

## 2014-04-23 ENCOUNTER — Inpatient Hospital Stay (HOSPITAL_COMMUNITY): Admission: RE | Admit: 2014-04-23 | Payer: BC Managed Care – PPO | Source: Ambulatory Visit

## 2014-04-27 ENCOUNTER — Ambulatory Visit: Payer: Self-pay

## 2014-04-27 NOTE — Lactation Note (Signed)
This note was copied from the chart of Shelly Markeesha Char. Lactation Consultation Note  Follow up visit with mom at baby's bedside in NICU. She states she cut back on her pumping for 2 days over the holidays and milk supply has decreased.  Mom states she was pumping 120 mls but now obtaining 30 mls total.  Instructed to pump every 2 hours during the day and every 4 hours at night.  Information given on fenugreek and mother's milk plus supplements.  Encouraged to call prn.  Patient Name: Shelly Bradley OEVOJ'J Date: 04/27/2014     Maternal Data    Feeding Feeding Type: Formula Length of feed: 60 min  LATCH Score/Interventions                      Lactation Tools Discussed/Used     Consult Status      Ave Filter 04/27/2014, 3:38 PM

## 2014-04-29 ENCOUNTER — Ambulatory Visit: Payer: Self-pay

## 2014-04-29 NOTE — Lactation Note (Signed)
This note was copied from the chart of Shelly Shuntel Fishburn. Lactation Consultation Note  Follow up visit made.  Mom states that she is now pumping 60-90 mls since increasing the frequency.  Encouraged to call with concerns prn.  Patient Name: Shelly Bradley PJPET'K Date: 04/29/2014     Maternal Data    Feeding    LATCH Score/Interventions                      Lactation Tools Discussed/Used     Consult Status      Ave Filter 04/29/2014, 11:33 AM

## 2014-05-24 ENCOUNTER — Emergency Department (HOSPITAL_COMMUNITY)
Admission: EM | Admit: 2014-05-24 | Discharge: 2014-05-24 | Disposition: A | Discharge status: Home / Self Care | Payer: Medicaid Other | Source: Home / Self Care | Attending: Family Medicine | Admitting: Family Medicine

## 2014-05-24 ENCOUNTER — Encounter (HOSPITAL_COMMUNITY): Payer: Self-pay | Admitting: Emergency Medicine

## 2014-05-24 DIAGNOSIS — R053 Chronic cough: Secondary | ICD-10-CM

## 2014-05-24 DIAGNOSIS — R05 Cough: Secondary | ICD-10-CM

## 2014-05-24 MED ORDER — OMEPRAZOLE 40 MG PO CPDR: 40.0000 mg | DELAYED_RELEASE_CAPSULE | Freq: Every day | ORAL | Status: DC

## 2014-05-24 MED ORDER — ALBUTEROL SULFATE HFA 108 (90 BASE) MCG/ACT IN AERS: 2.0000 | INHALATION_SPRAY | Freq: Four times a day (QID) | RESPIRATORY_TRACT | Status: DC | PRN

## 2014-05-24 MED ORDER — FLUTICASONE PROPIONATE 50 MCG/ACT NA SUSP: 2.0000 | Freq: Every day | NASAL | Status: DC

## 2014-05-24 NOTE — ED Provider Notes (Signed)
Shelly Bradley is a 28 y.o. female who presents to Urgent Care today for cough. Patient has had a cough for about one month. She has tried over-the-counter medications which have not helped. No fevers or chills vomiting or diarrhea. Patient has a history significant for asthma, seasonal allergies, and acid reflux. She is about 6 weeks postpartum and is currently breast-feeding.   Past Medical History  Diagnosis Date  . Asthma   . Abnormal pap 2004  . Fibroid    Past Surgical History  Procedure Laterality Date  . Cervical biopsy  w/ loop electrode excision  2004   History  Substance Use Topics  . Smoking status: Never Smoker   . Smokeless tobacco: Never Used  . Alcohol Use: Yes     Comment: Social Use before preg   ROS as above Medications: No current facility-administered medications for this encounter.   Current Outpatient Prescriptions  Medication Sig Dispense Refill  . fexofenadine (ALLEGRA) 180 MG tablet Take 180 mg by mouth daily.    Marland Kitchen albuterol (PROVENTIL HFA;VENTOLIN HFA) 108 (90 BASE) MCG/ACT inhaler Inhale 2 puffs into the lungs every 6 (six) hours as needed for wheezing or shortness of breath. 1 Inhaler 2  . ferrous sulfate 325 (65 FE) MG tablet Take 1 tablet (325 mg total) by mouth 2 (two) times daily with a meal. 60 tablet 3  . fluticasone (FLONASE) 50 MCG/ACT nasal spray Place 2 sprays into both nostrils daily. 16 g 2  . ibuprofen (ADVIL,MOTRIN) 200 MG tablet Take 400 mg by mouth every 6 (six) hours as needed for mild pain (tooth).    Marland Kitchen omeprazole (PRILOSEC) 40 MG capsule Take 1 capsule (40 mg total) by mouth daily. 30 capsule 1  . Prenatal Vit w/Fe-Methylfol-FA (PNV PO) Take by mouth.     No Known Allergies   Exam:  BP 143/91 mmHg  Pulse 82  Temp(Src) 97.2 F (36.2 C) (Oral)  Resp 14  SpO2 98%  Breastfeeding? Yes Gen: Well NAD HEENT: EOMI,  MMM normal posterior pharynx and tympanic membranes. Lungs: Normal work of breathing. CTABL Heart: RRR no MRG Abd:  NABS, Soft. Nondistended, Nontender Exts: Brisk capillary refill, warm and well perfused.   No results found for this or any previous visit (from the past 24 hour(s)). No results found.  Assessment and Plan: 28 y.o. female with chronic cough. Unclear etiology. Treatment with albuterol, Flonase nasal spray, and omeprazole for asthma allergies and reflux. Follow-up with PCP.  Discussed warning signs or symptoms. Please see discharge instructions. Patient expresses understanding.     Gregor Hams, MD 05/24/14 [phone]

## 2014-05-24 NOTE — ED Notes (Signed)
Pt has been suffering from seasonal allergies and a cough for about one month.  Pt denies a fever.

## 2014-05-24 NOTE — Discharge Instructions (Signed)
Thank you for coming in today. Use albuterol for coughing as needed. Use Flonase spray as directed. Use omeprazole daily for acid reflux. Call or go to the emergency room if you get worse, have trouble breathing, have chest pains, or palpitations.    Cough, Adult  A cough is a reflex that helps clear your throat and airways. It can help heal the body or may be a reaction to an irritated airway. A cough may only last 2 or 3 weeks (acute) or may last more than 8 weeks (chronic).  CAUSES Acute cough:  Viral or bacterial infections. Chronic cough:  Infections.  Allergies.  Asthma.  Post-nasal drip.  Smoking.  Heartburn or acid reflux.  Some medicines.  Chronic lung problems (COPD).  Cancer. SYMPTOMS   Cough.  Fever.  Chest pain.  Increased breathing rate.  High-pitched whistling sound when breathing (wheezing).  Colored mucus that you cough up (sputum). TREATMENT   A bacterial cough may be treated with antibiotic medicine.  A viral cough must run its course and will not respond to antibiotics.  Your caregiver may recommend other treatments if you have a chronic cough. HOME CARE INSTRUCTIONS   Only take over-the-counter or prescription medicines for pain, discomfort, or fever as directed by your caregiver. Use cough suppressants only as directed by your caregiver.  Use a cold steam vaporizer or humidifier in your bedroom or home to help loosen secretions.  Sleep in a semi-upright position if your cough is worse at night.  Rest as needed.  Stop smoking if you smoke. SEEK IMMEDIATE MEDICAL CARE IF:   You have pus in your sputum.  Your cough starts to worsen.  You cannot control your cough with suppressants and are losing sleep.  You begin coughing up blood.  You have difficulty breathing.  You develop pain which is getting worse or is uncontrolled with medicine.  You have a fever. MAKE SURE YOU:   Understand these instructions.  Will watch  your condition.  Will get help right away if you are not doing well or get worse. Document Released: 11/10/2010 Document Revised: 08/06/2011 Document Reviewed: 11/10/2010 [facility] Patient Information 2015 [place], [place]. This information is not intended to replace advice given to you by your health care provider. Make sure you discuss any questions you have with your health care provider.

## 2014-05-28 NOTE — L&D Delivery Note (Addendum)
Delivery Note At 7:31AM, on May 16, 2015, a viable female "Shelly Bradley" was delivered via Vaginal, Vacuum (Extractor) (Presentation: Occiput Anterior) by Dr. Octavio Manns.  APGAR: 9, 9; weight pending Placenta status: Intact, Spontaneous.  Cord: 3 vessels with the following complications: .  Cord pH: N/A  Anesthesia: Epidural  Episiotomy: None Lacerations: Periurethral Suture Repair: 3.0 vicryl Est. Blood Loss (mL): 250  Mom to postpartum.  Baby to Couplet care / Skin to Skin.  Maryann Conners MSN, CNM 05/16/2015, 8:04 AM  Was called to room by JE, CNM secondary to bradycardia while pushing.  She had already talked to the patient about the R/B/A of a vaccuum and patient wanted to proceed.  Vacuum extraction was successful with one pull at 3mmHg (green zone).  Baby came out crying.  A small periurethral tear was noted and repaired with 3-0 vicryl via a figure of eight stitch.

## 2014-06-15 NOTE — Progress Notes (Signed)
Mom stopped by to buy a nipple shield. Suggested making an OP appointment for instruction in applying and use of NS. Baby was in NICU for 6 Shelly Bradley. Has been home for 2 Shelly Bradley. Mom stopped pumping 2 Shelly Bradley ago but wants to try breast feeding again. Has Medela pump- encouraged to pump q 3 hours to build milk supply. Has already started pumping since yesterday.

## 2014-06-24 ENCOUNTER — Ambulatory Visit (HOSPITAL_COMMUNITY)
Admission: RE | Admit: 2014-06-24 | Discharge: 2014-06-24 | Disposition: A | Discharge status: Home / Self Care | Payer: Medicaid Other | Source: Ambulatory Visit | Attending: *Deleted | Admitting: *Deleted

## 2014-07-17 ENCOUNTER — Emergency Department (HOSPITAL_COMMUNITY)
Admission: EM | Admit: 2014-07-17 | Discharge: 2014-07-17 | Disposition: A | Discharge status: Home / Self Care | Payer: Medicaid Other | Attending: Emergency Medicine | Admitting: Emergency Medicine

## 2014-07-17 ENCOUNTER — Encounter (HOSPITAL_COMMUNITY): Payer: Self-pay | Admitting: Adult Health

## 2014-07-17 ENCOUNTER — Emergency Department (HOSPITAL_COMMUNITY): Payer: Medicaid Other

## 2014-07-17 DIAGNOSIS — K0530 Chronic periodontitis, unspecified: Secondary | ICD-10-CM | POA: Insufficient documentation

## 2014-07-17 DIAGNOSIS — Z79899 Other long term (current) drug therapy: Secondary | ICD-10-CM | POA: Insufficient documentation

## 2014-07-17 DIAGNOSIS — Z8742 Personal history of other diseases of the female genital tract: Secondary | ICD-10-CM | POA: Diagnosis not present

## 2014-07-17 DIAGNOSIS — R05 Cough: Secondary | ICD-10-CM

## 2014-07-17 DIAGNOSIS — Z3202 Encounter for pregnancy test, result negative: Secondary | ICD-10-CM | POA: Insufficient documentation

## 2014-07-17 DIAGNOSIS — J45909 Unspecified asthma, uncomplicated: Secondary | ICD-10-CM | POA: Diagnosis not present

## 2014-07-17 DIAGNOSIS — R059 Cough, unspecified: Secondary | ICD-10-CM

## 2014-07-17 LAB — POC URINE PREG, ED: Preg Test, Ur: NEGATIVE

## 2014-07-17 MED ORDER — HYDROCOD POLST-CHLORPHEN POLST 10-8 MG/5ML PO LQCR: 5.0000 mL | Freq: Two times a day (BID) | ORAL | Status: DC | PRN

## 2014-07-17 MED ORDER — ALBUTEROL SULFATE HFA 108 (90 BASE) MCG/ACT IN AERS
2.0000 | INHALATION_SPRAY | Freq: Once | RESPIRATORY_TRACT | Status: AC
  Administered 2014-07-17: 2 via RESPIRATORY_TRACT
  Filled 2014-07-17: qty 6.7

## 2014-07-17 MED ORDER — HYDROCOD POLST-CHLORPHEN POLST 10-8 MG/5ML PO LQCR
5.0000 mL | Freq: Once | ORAL | Status: AC
  Administered 2014-07-17: 5 mL via ORAL
  Filled 2014-07-17: qty 5

## 2014-07-17 MED ORDER — ALBUTEROL SULFATE HFA 108 (90 BASE) MCG/ACT IN AERS: 1.0000 | INHALATION_SPRAY | Freq: Four times a day (QID) | RESPIRATORY_TRACT | Status: DC | PRN

## 2014-07-17 MED ORDER — DEXAMETHASONE 4 MG PO TABS
12.0000 mg | ORAL_TABLET | Freq: Once | ORAL | Status: AC
  Administered 2014-07-17: 12 mg via ORAL
  Filled 2014-07-17: qty 3

## 2014-07-17 NOTE — Discharge Instructions (Signed)
°Emergency Department Resource Guide °1) Find a Doctor and Pay Out of Pocket °Although you won't have to find out who is covered by your insurance plan, it is a good idea to ask around and get recommendations. You will then need to call the office and see if the doctor you have chosen will accept you as a new patient and what types of options they offer for patients who are self-pay. Some doctors offer discounts or will set up payment plans for their patients who do not have insurance, but you will need to ask so you aren't surprised when you get to your appointment. ° °2) Contact Your Local Health Department °Not all health departments have doctors that can see patients for sick visits, but many do, so it is worth a call to see if yours does. If you don't know where your local health department is, you can check in your phone book. The CDC also has a tool to help you locate your state's health department, and many state websites also have listings of all of their local health departments. ° °3) Find a Walk-in Clinic °If your illness is not likely to be very severe or complicated, you may want to try a walk in clinic. These are popping up all over the country in pharmacies, drugstores, and shopping centers. They're usually staffed by nurse practitioners or physician assistants that have been trained to treat common illnesses and complaints. They're usually fairly quick and inexpensive. However, if you have serious medical issues or chronic medical problems, these are probably not your best option. ° °No Primary Care Doctor: °- Call [facility] at  832-8000 - they can help you locate a primary care doctor that  accepts your insurance, provides certain services, etc. °- Physician Referral Service- 1-800-533-3463 ° °Chronic Pain Problems: °Organization         Address  Phone   Notes  °[facility]  (336) 297-2271 Patients need to be referred by their primary care doctor.  ° °Medication  Assistance: °Organization         Address  Phone   Notes  °[place] [facility] [address] °[place], [place] [address] (336) 641-8030 --Must be a resident of [place] °-- Must have NO insurance coverage whatsoever (no Medicaid/ Medicare, etc.) °-- The pt. MUST have a primary care doctor that directs their care regularly and follows them in the community °  °[facility]  (866) 331-1348   °[employer]  (888) 892-1162   ° °Agencies that provide inexpensive medical care: °Organization         Address  Phone   Notes  °[facility]  (336) 832-8035   °[facility]    (336) 832-7272   °[facility] [address] °[place], [place] [address] (336) 832-4777   °[facility] [address], °[place] (336) 271-4999   °[facility]    (336) 373-0678   °[facility]    (336) 272-1050   °[facility] ° [address], [place] Phone:  (336) 832-4444, Fax:  (336) 832-4440 Hours of Operation:  9 am - 6 pm, M-F.  Also accepts Medicaid/Medicare and self-pay.  °[facility] ° [address], [place] Phone: (336) 832-3150, Fax: (336) 832-3151. Hours of Operation:  8:30 am - 5:30 pm, M-F.  Also accepts Medicaid and self-pay.  °[facility] [place] [address], [place] Phone: (336) 878-6027   °[facility] [address], [place], [place] (336)723-1848, Ext. 123 Mondays & Thursdays: 7-9 AM.  First 15 patients are seen on a first come, first serve basis. °  ° °Medicaid-accepting [place] Providers: ° °Organization         Address  Phone   Notes  °[facility] [address], [place] (336) 641-2100 Also accepts self-pay patients.  °[facility] [address], [place] ° (336) 856-9996   °[facility] [address], [place]  (336) 288-8857   °[facility] [address] [place] [address], [place] (336) 299-7000   °Veita Bland [address], [place]  ° (336) 373-1557 Only accepts [place] Access Medicaid patients after they have their name applied to their card.  ° °Self-Pay (no insurance) in [place]: ° °Organization         Address  Phone   Notes  °Sickle Cell Patients, [facility] [address], [place] (336) 832-1970   °[facility] [address], [place] (336) 832-4400   °[facility] ° [address], [place] (336) 992-4800   °[facility]/Dr. Osei-Bonsu ° [address] [place] [address], [place] or [address], [place] (336) 841-8500 Phone number for both [place] and [place] locations is the same.  °[facility] [address], [place] (336) 299-0000   °[facility] [address] [place] [address], [place] or [address] (336) 852-7530 °(336) 878-2260   °[facility] [address], [place] (336) 350-1642, phone; (336) 294-5005, fax Sees patients 1st and 3rd Saturday of every month.  Must not qualify for public or private insurance (i.e. Medicaid, Medicare, [place] Health Choice, Veterans' Benefits) • Household income should be no more than 200% of the poverty level •The clinic cannot treat you if you are pregnant or think you are pregnant • Sexually transmitted diseases are not treated at the clinic.  ° ° °Dental Care: °Organization         Address  Phone  Notes  °[facility] [facility] [address], [place] (336) 641-6152 Accepts children up to age 21 who are enrolled in Medicaid or [place] Health Choice; pregnant women with a Medicaid card; and children who have applied for Medicaid or [place] Health Choice, but were declined, whose parents can pay a reduced fee at time of service.  °[place]  [facility] [place]  [address], [place] (336) 641-7733 Accepts children up to age 21 who are enrolled in Medicaid or [place] Health Choice; pregnant women with a Medicaid card; and children who have applied for Medicaid or [place] Health Choice, but were declined, whose parents can pay a reduced fee at time of service.  °[facility] ° [address],  (336) 641-4533 Patients are seen by appointment only. Walk-ins are not accepted. [facility] will see patients 18 years of age and older. °Monday - Tuesday (8am-5pm) °Most Wednesdays (8:30-5pm) °$30 per visit, cash only  °[facility] ° [address], [place] (336) 641-4533 Patients are seen by appointment only. Walk-ins are not accepted. [facility] will see patients 18 years of age and older. °One   Wednesday Evening (Monthly: Volunteer Based).  $30 per visit, cash only  °[facility]  (919) 537-3737 for adults; Children under age 4, call Graduate Pediatric Dentistry at (919) [phone]. Children aged 4-14, please call (919) [phone] to request a pediatric application. ° Dental services are provided in all areas of dental care including fillings, crowns and bridges, complete and partial dentures, implants, gum treatment, root canals, and extractions. Preventive care is also provided. Treatment is provided to both adults and children. °Patients are selected via a lottery and there is often a waiting list. °  °[facility] [address], °[place] ° (336) 763-8833 [email or web address] °  °[facility] [address], [place], [place] (336)723-1848, Ext. 123 Second and Fourth Thursday of each month, opens at 6:30 AM; Clinic ends at 9 AM.  Patients are seen on a first-come first-served basis, and a limited number are seen during each clinic.  ° °[facility] ° [address], [place], [place] (336) 723-7904    Eligibility Requirements °You must have lived in [place], or [place] counties for at least the last three months. °  You cannot be eligible for state or federal sponsored healthcare insurance, including Veterans Administration, Medicaid, or Medicare. °  You generally cannot be eligible for healthcare insurance through your employer.  °  How to apply: °Eligibility screenings are held every Tuesday and Wednesday afternoon from 1:00 pm until 4:00 pm. You do not need an appointment for the interview!  °[facility] [address], [place], [place] 336-631-2330   °[facility]  336-342-8273   °[facility]  336-703-3100   °[facility]  336-570-6415   ° °Behavioral Health Resources in the Community: °Intensive Outpatient Programs °Organization         Address  Phone  Notes  °[facility] [address], [place], [place] 336-878-6098   °[facility] [address], [place], [place] 336-832-9800   °[facility] [address], [place], [place] ° 336-882-2125   °[facility] [address],  °[place], [place] 1-800-853-5163 or 336-641-4981   °Substance Abuse Resources °Organization         Address  Phone  Notes  °[facility]  336-882-2125   °[facility]  336-784-9470   °[facility]  336-285-9073   °[facility]  336-845-3988   °Residential & Outpatient Substance Abuse Program  1-800-659-3381   °Psychological Services °Organization         Address  Phone  Notes  °[facility]  336- 832-9600   °[facility]  336- 378-7881   °[facility] [address], [place] 1-800-853-5163 or 336-641-4981   ° °Mobile Crisis Teams °Organization         Address  Phone  Notes  °[facility], Mobile Crisis Care Unit  1-877-626-1772   °Assertive °[facility] ° [address]  [place], [place] 336-834-9664   °Sharon DeEsch [address] °[place] 336-554-5454   ° °Self-Help/Support Groups °Organization         Address  Phone             Notes  °[facility] - variety of support groups  336- 373-1402 Call for more information  °Narcotics Anonymous (NA), [facility] [address], °[place]  2 meetings at this location  ° °  Residential Treatment Programs °Organization         Address  Phone  Notes  °[facility] [address],    °[place]  1-866-801-8205   °[facility] ° [address], [place], [place] 704-293-8524   °[facility] [address], [place] 336-845-3988 Admissions: 8am-3pm M-F  °[facility] [address]    °[place], [place] 336-841-1104   °[facility] [address], [place], [place] 336-379-7146   °[facility] [address]  °[place], [place] 336-285-9073   °[facility] Intensive Outpatient [address], [place], [place] 336-852-3033   °[facility] [address]  °[place], [place] 1-877-615-2722 or 336-784-9470   °[facility] [address] [place], [place] 336-227-7417 Accepts Medicaid  °[facility] [address]  °[place] 1-800-659-3381 Substance Abuse/Addiction Treatment  ° °[place] Behavioral Health Resources °Organization         Address  Phone  Notes  °[facility]  (888) 581-9988   °Julie Brannon, PhD [address] [place], [place]   (336) 349-5553 or (336) 951-0000   °[facility]   [address] °[place], [place], [place] (336) 349-4454   °[facility] [address], [place], [place] (336) 342-8316 Insurance/Medicaid/sponsorship through [facility]  °[facility] [address]                                    [place], [place] (336) 342-8316 Therapy/tele-psych/case    °[facility] [address]  ° [place], [place] (336) 349-2233    °Dr. Arfeen  (336) 349-4544   °[facility] [place]  [employer] [place] [facility] 1) [address], [place] °2) [address], [place] °3)  [address], [place] (336) 349-3220 °(336) 342-7768 ° °(336) 342-8140   °[place] [facility] (336) 342-1394 or (336) 342-3537 (After Hours)    ° ° °

## 2014-07-17 NOTE — ED Notes (Signed)
Presents with chronic cough months, recently been coughing up brownish colored phlegm, she has been on multiple medications with no relief.

## 2014-07-17 NOTE — ED Provider Notes (Signed)
CSN: [id number]     Arrival date & time 07/17/14  1715 History   First MD Initiated Contact with Patient 07/17/14 1729     Chief Complaint  Patient presents with  . Cough     (Consider location/radiation/quality/duration/timing/severity/associated sxs/prior Treatment) Patient is a 29 y.o. female presenting with cough. The history is provided by the patient.  Cough Cough characteristics:  Dry and productive Sputum characteristics:  Clear and green Severity:  Mild Onset quality:  Gradual Duration: 5 months. Timing:  Constant Progression:  Waxing and waning Chronicity:  Chronic Smoker: no   Context: not sick contacts, not smoke exposure, not upper respiratory infection and not weather changes   Relieved by:  Nothing Worsened by:  Nothing tried Ineffective treatments:  Cough suppressants, beta-agonist inhaler and decongestant Associated symptoms: no chest pain, no chills, no fever, no shortness of breath, no sinus congestion and no sore throat     Past Medical History  Diagnosis Date  . Asthma   . Abnormal pap 2004  . Fibroid    Past Surgical History  Procedure Laterality Date  . Cervical biopsy  w/ loop electrode excision  2004   Family History  Problem Relation Age of Onset  . Diabetes Maternal Aunt   . Diabetes Maternal Grandmother    History  Substance Use Topics  . Smoking status: Never Smoker   . Smokeless tobacco: Never Used  . Alcohol Use: Yes     Comment: Social Use before preg   OB History    Gravida Para Term Preterm AB TAB SAB Ectopic Multiple Living   3 1 0 1 2 0 2 0 0 1      Review of Systems  Constitutional: Negative for fever and chills.  HENT: Negative for sore throat.   Respiratory: Positive for cough. Negative for shortness of breath.   Cardiovascular: Negative for chest pain.  All other systems reviewed and are negative.     Allergies  Review of patient's allergies indicates no known allergies.  Home Medications   Prior to Admission  medications   Medication Sig Start Date End Date Taking? Authorizing Provider  albuterol (PROVENTIL HFA;VENTOLIN HFA) 108 (90 BASE) MCG/ACT inhaler Inhale 2 puffs into the lungs every 6 (six) hours as needed for wheezing or shortness of breath. 05/24/14   Gregor Hams, MD  ferrous sulfate 325 (65 FE) MG tablet Take 1 tablet (325 mg total) by mouth 2 (two) times daily with a meal. 04/06/14   Venus Standard, CNM  fexofenadine (ALLEGRA) 180 MG tablet Take 180 mg by mouth daily.    Historical Provider, MD  fluticasone (FLONASE) 50 MCG/ACT nasal spray Place 2 sprays into both nostrils daily. 05/24/14   Gregor Hams, MD  ibuprofen (ADVIL,MOTRIN) 200 MG tablet Take 400 mg by mouth every 6 (six) hours as needed for mild pain (tooth).    Historical Provider, MD  omeprazole (PRILOSEC) 40 MG capsule Take 1 capsule (40 mg total) by mouth daily. 05/24/14   Gregor Hams, MD  Prenatal Vit w/Fe-Methylfol-FA (PNV PO) Take by mouth.    Historical Provider, MD   BP 127/75 mmHg  Pulse 71  Temp(Src) 98.2 F (36.8 C) (Oral)  Resp 16  Ht 5' 1.25" (1.556 m)  SpO2 99%  LMP 07/05/2014  Breastfeeding? No Physical Exam  Constitutional: She is oriented to person, place, and time. She appears well-developed and well-nourished. No distress.  HENT:  Head: Normocephalic and atraumatic.  Mouth/Throat: Oropharynx is clear and moist.  No intraoral abscess  Eyes: EOM are normal. Pupils are equal, round, and reactive to light.  Neck: Normal range of motion. Neck supple.  Cardiovascular: Normal rate and regular rhythm.  Exam reveals no friction rub.   No murmur heard. Pulmonary/Chest: Effort normal and breath sounds normal. No respiratory distress. She has no wheezes. She has no rales.  Abdominal: Soft. She exhibits no distension. There is no tenderness. There is no rebound.  Musculoskeletal: Normal range of motion. She exhibits no edema.  Neurological: She is alert and oriented to person, place, and time.  Skin: She  is not diaphoretic.  Nursing note and vitals reviewed.   ED Course  Procedures (including critical care time) Labs Review Labs Reviewed  POC URINE PREG, ED    Imaging Review Dg Chest 2 View  07/17/2014   CLINICAL DATA:  Chronic cough, nausea and vomiting for 5 months  EXAM: CHEST  2 VIEW  COMPARISON:  None.  FINDINGS: Cardiomediastinal silhouette is unremarkable. No acute infiltrate or pleural effusion. No pulmonary edema. Bony thorax is unremarkable.  IMPRESSION: No active cardiopulmonary disease.   Electronically Signed   By: Lahoma Crocker M.D.   On: 07/17/2014 18:26     EKG Interpretation None      MDM   Final diagnoses:  Cough  Pericoronitis    50F presents with chronic cough for 5 months. No relief with cough medicine, albuterol inhaler. Worse at night. No fevers/chills. Occasionally productive of brown sputum/clear sputum, most recently has been clear.  Not a smoker, no work place exposures. Feels like it's a tickle in her throat that has worsened. Will give decadron since patient could have cough variant asthma. Will check CXR. Tussionex given. Of note, also has pericoronitis, will give dental referral.   CXR normal. Given Rx for albuterol, tussionex. Given referral to [facility]monary [facility].  Evelina Bucy, MD 07/17/14 Einar Crow

## 2014-10-06 ENCOUNTER — Emergency Department (INDEPENDENT_AMBULATORY_CARE_PROVIDER_SITE_OTHER)
Admission: EM | Admit: 2014-10-06 | Discharge: 2014-10-06 | Disposition: A | Discharge status: Home / Self Care | Payer: Self-pay | Source: Home / Self Care | Attending: Family Medicine | Admitting: Family Medicine

## 2014-10-06 ENCOUNTER — Encounter (HOSPITAL_COMMUNITY): Payer: Self-pay

## 2014-10-06 DIAGNOSIS — Z3201 Encounter for pregnancy test, result positive: Secondary | ICD-10-CM

## 2014-10-06 DIAGNOSIS — Z3490 Encounter for supervision of normal pregnancy, unspecified, unspecified trimester: Secondary | ICD-10-CM

## 2014-10-06 DIAGNOSIS — N898 Other specified noninflammatory disorders of vagina: Secondary | ICD-10-CM

## 2014-10-06 DIAGNOSIS — N920 Excessive and frequent menstruation with regular cycle: Secondary | ICD-10-CM

## 2014-10-06 LAB — POCT PREGNANCY, URINE: Preg Test, Ur: POSITIVE — AB

## 2014-10-06 NOTE — Discharge Instructions (Signed)
Thank you for coming in today. Start taking prenatal vitamins. Follow-up with [facility] [address] [place], [place] [phone]  [facility] [address] [place], [place] [phone]  [facility]: Delsa Bern MD [place], [place] [phone]  [facility] [place], [place] [phone]  [facility]: Dian Queen MD [address] [place], [place] [phone]  [facility]: [place] MD [place], [place] [phone]  [facility] [address] [place], [place] [phone]   [facility]: [address] [place], [place] [address] [phone]   First Trimester of Pregnancy The first trimester of pregnancy is from week 1 until the end of week 12 (months 1 through 3). A week after a sperm fertilizes an egg, the egg will implant on the wall of the uterus. This embryo will begin to develop into a baby. Genes from you and your partner are forming the baby. The female genes determine whether the baby is a boy or a girl. At 6-8 weeks, the eyes and face are formed, and the heartbeat can be seen on ultrasound. At the end of 12 weeks, all the baby's organs are formed.  Now that you are pregnant, you will want to do everything you can to have a healthy baby. Two of the most important things are to get good prenatal care and to follow your health care provider's instructions. Prenatal care is all the medical care you receive before the baby's birth. This care will help prevent, find, and treat any problems during the pregnancy and childbirth. BODY CHANGES Your body goes through many changes during pregnancy. The changes vary from woman to woman.   You may gain or lose a couple of pounds at first.  You may feel sick to your stomach (nauseous)  and throw up (vomit). If the vomiting is uncontrollable, call your health care provider.  You may tire easily.  You may develop headaches that can be relieved by medicines approved by your health care provider.  You may urinate more often. Painful urination may mean you have a bladder infection.  You may develop heartburn as a result of your pregnancy.  You may develop constipation because certain hormones are causing the muscles that push waste through your intestines to slow down.  You may develop hemorrhoids or swollen, bulging veins (varicose veins).  Your breasts may begin to grow larger and become tender. Your nipples may stick out more, and the tissue that surrounds them (areola) may become darker.  Your gums may bleed and may be sensitive to brushing and flossing.  Dark spots or blotches (chloasma, mask of pregnancy) may develop on your face. This will likely fade after the baby is born.  Your menstrual periods will stop.  You may have a loss of appetite.  You may develop cravings for certain kinds of food.  You may have changes in your emotions from day to day, such as being excited to be pregnant or being concerned that something may go wrong with the pregnancy and baby.  You may have more vivid and strange dreams.  You may have changes in your hair. These can include thickening of your hair, rapid growth, and changes in texture. Some women also have hair loss during or after pregnancy, or hair that feels dry or thin. Your hair will most likely  return to normal after your baby is born. WHAT TO EXPECT AT YOUR PRENATAL VISITS During a routine prenatal visit:  You will be weighed to make sure you and the baby are growing normally.  Your blood pressure will be taken.  Your abdomen will be measured to track your baby's growth.  The fetal heartbeat will be listened to starting around week 10 or 12 of your pregnancy.  Test results from any previous visits will be  discussed. Your health care provider may ask you:  How you are feeling.  If you are feeling the baby move.  If you have had any abnormal symptoms, such as leaking fluid, bleeding, severe headaches, or abdominal cramping.  If you have any questions. Other tests that may be performed during your first trimester include:  Blood tests to find your blood type and to check for the presence of any previous infections. They will also be used to check for low iron levels (anemia) and Rh antibodies. Later in the pregnancy, blood tests for diabetes will be done along with other tests if problems develop.  Urine tests to check for infections, diabetes, or protein in the urine.  An ultrasound to confirm the proper growth and development of the baby.  An amniocentesis to check for possible genetic problems.  Fetal screens for spina bifida and Down syndrome.  You may need other tests to make sure you and the baby are doing well. HOME CARE INSTRUCTIONS  Medicines  Follow your health care provider's instructions regarding medicine use. Specific medicines may be either safe or unsafe to take during pregnancy.  Take your prenatal vitamins as directed.  If you develop constipation, try taking a stool softener if your health care provider approves. Diet  Eat regular, well-balanced meals. Choose a variety of foods, such as meat or vegetable-based protein, fish, milk and low-fat dairy products, vegetables, fruits, and whole grain breads and cereals. Your health care provider will help you determine the amount of weight gain that is right for you.  Avoid raw meat and uncooked cheese. These carry germs that can cause birth defects in the baby.  Eating four or five small meals rather than three large meals a day may help relieve nausea and vomiting. If you start to feel nauseous, eating a few soda crackers can be helpful. Drinking liquids between meals instead of during meals also seems to help nausea and  vomiting.  If you develop constipation, eat more high-fiber foods, such as fresh vegetables or fruit and whole grains. Drink enough fluids to keep your urine clear or pale yellow. Activity and Exercise  Exercise only as directed by your health care provider. Exercising will help you:  Control your weight.  Stay in shape.  Be prepared for labor and delivery.  Experiencing pain or cramping in the lower abdomen or low back is a good sign that you should stop exercising. Check with your health care provider before continuing normal exercises.  Try to avoid standing for long periods of time. Move your legs often if you must stand in one place for a long time.  Avoid heavy lifting.  Wear low-heeled shoes, and practice good posture.  You may continue to have sex unless your health care provider directs you otherwise. Relief of Pain or Discomfort  Wear a good support bra for breast tenderness.   Take warm sitz baths to soothe any pain or discomfort caused by hemorrhoids. Use hemorrhoid cream if your health care provider approves.   Rest with  your legs elevated if you have leg cramps or low back pain.  If you develop varicose veins in your legs, wear support hose. Elevate your feet for 15 minutes, 3-4 times a day. Limit salt in your diet. Prenatal Care  Schedule your prenatal visits by the twelfth week of pregnancy. They are usually scheduled monthly at first, then more often in the last 2 months before delivery.  Write down your questions. Take them to your prenatal visits.  Keep all your prenatal visits as directed by your health care provider. Safety  Wear your seat belt at all times when driving.  Make a list of emergency phone numbers, including numbers for family, friends, the hospital, and police and fire departments. General Tips  Ask your health care provider for a referral to a local prenatal education class. Begin classes no later than at the beginning of month 6 of  your pregnancy.  Ask for help if you have counseling or nutritional needs during pregnancy. Your health care provider can offer advice or refer you to specialists for help with various needs.  Do not use hot tubs, steam rooms, or saunas.  Do not douche or use tampons or scented sanitary pads.  Do not cross your legs for long periods of time.  Avoid cat litter boxes and soil used by cats. These carry germs that can cause birth defects in the baby and possibly loss of the fetus by miscarriage or stillbirth.  Avoid all smoking, herbs, alcohol, and medicines not prescribed by your health care provider. Chemicals in these affect the formation and growth of the baby.  Schedule a dentist appointment. At home, brush your teeth with a soft toothbrush and be gentle when you floss. SEEK MEDICAL CARE IF:   You have dizziness.  You have mild pelvic cramps, pelvic pressure, or nagging pain in the abdominal area.  You have persistent nausea, vomiting, or diarrhea.  You have a bad smelling vaginal discharge.  You have pain with urination.  You notice increased swelling in your face, hands, legs, or ankles. SEEK IMMEDIATE MEDICAL CARE IF:   You have a fever.  You are leaking fluid from your vagina.  You have spotting or bleeding from your vagina.  You have severe abdominal cramping or pain.  You have rapid weight gain or loss.  You vomit blood or material that looks like coffee grounds.  You are exposed to [place] measles and have never had them.  You are exposed to fifth disease or chickenpox.  You develop a severe headache.  You have shortness of breath.  You have any kind of trauma, such as from a fall or a car accident. Document Released: 05/08/2001 Document Revised: 09/28/2013 Document Reviewed: 03/24/2013 [facility] Patient Information 2015 [place], [place]. This information is not intended to replace advice given to you by your health care provider. Make sure you discuss any  questions you have with your health care provider.

## 2014-10-06 NOTE — ED Notes (Signed)
6 months post partum. Had an episode of spotting 4-1, thought that may have been a menstrual period, but unsure. Sexually active w/o BC, but not attempting to concieveConcerned due to history of needing to be on medications to preserve pregnancy the last time.

## 2014-10-06 NOTE — ED Provider Notes (Signed)
Shelly Bradley is a 29 y.o. female who presents to Urgent Care today for possible pregnancy. Patient had a positive home pregnancy test about a week ago. She is 6 months postpartum and is breast-feeding. She's been having irregular menstrual periods. She thinks perhaps her last menstrual period was April 1. She denies any fevers or chills nausea vomiting or diarrhea. No prenatal vitamins.   Past Medical History  Diagnosis Date  . Asthma   . Abnormal pap 2004  . Fibroid    Past Surgical History  Procedure Laterality Date  . Cervical biopsy  w/ loop electrode excision  2004   History  Substance Use Topics  . Smoking status: Never Smoker   . Smokeless tobacco: Never Used  . Alcohol Use: Yes     Comment: Social Use before preg   ROS as above Medications: No current facility-administered medications for this encounter.   Current Outpatient Prescriptions  Medication Sig Dispense Refill  . albuterol (PROVENTIL HFA;VENTOLIN HFA) 108 (90 BASE) MCG/ACT inhaler Inhale 2 puffs into the lungs every 6 (six) hours as needed for wheezing or shortness of breath. 1 Inhaler 2  . albuterol (PROVENTIL HFA;VENTOLIN HFA) 108 (90 BASE) MCG/ACT inhaler Inhale 1-2 puffs into the lungs every 6 (six) hours as needed for wheezing or shortness of breath. 1 Inhaler 3  . chlorpheniramine-HYDROcodone (TUSSIONEX) 10-8 MG/5ML LQCR Take 5 mLs by mouth every 12 (twelve) hours as needed for cough. 115 mL 0  . dextromethorphan (DELSYM) 30 MG/5ML liquid Take 60 mg by mouth as needed for cough.    . ferrous sulfate 325 (65 FE) MG tablet Take 1 tablet (325 mg total) by mouth 2 (two) times daily with a meal. 60 tablet 3  . fexofenadine (ALLEGRA) 180 MG tablet Take 180 mg by mouth daily.     No Known Allergies   Exam:  BP 116/72 mmHg  Pulse 89  Temp(Src) 98.4 F (36.9 C) (Oral)  Resp 16  SpO2 99%  Gen: Well NAD HEENT: EOMI,  MMM Lungs: Normal work of breathing. CTABL Heart: RRR no MRG Abd: NABS, Soft.  Nondistended, Nontender Exts: Brisk capillary refill, warm and well perfused.   Results for orders placed or performed during the hospital encounter of 10/06/14 (from the past 24 hour(s))  Pregnancy, urine POC     Status: Abnormal   Collection Time: 10/06/14  3:11 PM  Result Value Ref Range   Preg Test, Ur POSITIVE (A) NEGATIVE   No results found.  Assessment and Plan: 29 y.o. female with pregnancy. Start prenatal vitamin follow-up with OB/GYN  Discussed warning signs or symptoms. Please see discharge instructions. Patient expresses understanding.     Gregor Hams, MD 10/06/14 [phone]

## 2014-11-12 ENCOUNTER — Inpatient Hospital Stay (HOSPITAL_COMMUNITY): Payer: Medicaid Other

## 2014-11-12 ENCOUNTER — Inpatient Hospital Stay (HOSPITAL_COMMUNITY)
Admission: AD | Admit: 2014-11-12 | Discharge: 2014-11-12 | Disposition: A | Discharge status: Home / Self Care | Payer: Medicaid Other | Source: Ambulatory Visit | Attending: Obstetrics & Gynecology | Admitting: Obstetrics & Gynecology

## 2014-11-12 ENCOUNTER — Encounter (HOSPITAL_COMMUNITY): Payer: Self-pay | Admitting: *Deleted

## 2014-11-12 DIAGNOSIS — Z3A10 10 weeks gestation of pregnancy: Secondary | ICD-10-CM | POA: Diagnosis not present

## 2014-11-12 DIAGNOSIS — O209 Hemorrhage in early pregnancy, unspecified: Secondary | ICD-10-CM

## 2014-11-12 DIAGNOSIS — O208 Other hemorrhage in early pregnancy: Secondary | ICD-10-CM | POA: Diagnosis not present

## 2014-11-12 DIAGNOSIS — R109 Unspecified abdominal pain: Secondary | ICD-10-CM | POA: Diagnosis not present

## 2014-11-12 DIAGNOSIS — O418X10 Other specified disorders of amniotic fluid and membranes, first trimester, not applicable or unspecified: Secondary | ICD-10-CM

## 2014-11-12 DIAGNOSIS — O468X1 Other antepartum hemorrhage, first trimester: Secondary | ICD-10-CM

## 2014-11-12 LAB — CBC WITH DIFFERENTIAL/PLATELET
BASOS ABS: 0 10*3/uL (ref 0.0–0.1)
BASOS PCT: 0 % (ref 0–1)
EOS ABS: 0.1 10*3/uL (ref 0.0–0.7)
Eosinophils Relative: 1 % (ref 0–5)
HCT: 36 % (ref 36.0–46.0)
HEMOGLOBIN: 12 g/dL (ref 12.0–15.0)
Lymphocytes Relative: 22 % (ref 12–46)
Lymphs Abs: 2.2 10*3/uL (ref 0.7–4.0)
MCH: 25.9 pg — AB (ref 26.0–34.0)
MCHC: 33.3 g/dL (ref 30.0–36.0)
MCV: 77.6 fL — AB (ref 78.0–100.0)
Monocytes Absolute: 0.7 10*3/uL (ref 0.1–1.0)
Monocytes Relative: 7 % (ref 3–12)
Neutro Abs: 6.8 10*3/uL (ref 1.7–7.7)
Neutrophils Relative %: 70 % (ref 43–77)
Platelets: 207 10*3/uL (ref 150–400)
RBC: 4.64 MIL/uL (ref 3.87–5.11)
RDW: 15.1 % (ref 11.5–15.5)
WBC: 9.8 10*3/uL (ref 4.0–10.5)

## 2014-11-12 LAB — URINE MICROSCOPIC-ADD ON

## 2014-11-12 LAB — URINALYSIS, ROUTINE W REFLEX MICROSCOPIC
BILIRUBIN URINE: NEGATIVE
Glucose, UA: NEGATIVE mg/dL
Ketones, ur: NEGATIVE mg/dL
Nitrite: NEGATIVE
Protein, ur: NEGATIVE mg/dL
SPECIFIC GRAVITY, URINE: 1.02 (ref 1.005–1.030)
Urobilinogen, UA: 0.2 mg/dL (ref 0.0–1.0)
pH: 7 (ref 5.0–8.0)

## 2014-11-12 LAB — WET PREP, GENITAL
Trich, Wet Prep: NONE SEEN
Yeast Wet Prep HPF POC: NONE SEEN

## 2014-11-12 LAB — HCG, QUANTITATIVE, PREGNANCY: hCG, Beta Chain, Quant, S: 100783 m[IU]/mL — ABNORMAL HIGH (ref <5)

## 2014-11-12 NOTE — MAU Note (Signed)
Found out preg back in May,  Has not seen doctor yet-is waiting on insurance. Started bleeding about 2 wks ago, not heavy, but started cramping a couple days ago.

## 2014-11-12 NOTE — MAU Note (Signed)
Urine in lab 

## 2014-11-12 NOTE — Discharge Instructions (Signed)
Subchorionic Hematoma A subchorionic hematoma is a gathering of blood between the outer wall of the placenta and the inner wall of the womb (uterus). The placenta is the organ that connects the fetus to the wall of the uterus. The placenta performs the feeding, breathing (oxygen to the fetus), and waste removal (excretory work) of the fetus.  Subchorionic hematoma is the most common abnormality found on a result from ultrasonography done during the first trimester or early second trimester of pregnancy. If there has been little or no vaginal bleeding, early small hematomas usually shrink on their own and do not affect your baby or pregnancy. The blood is gradually absorbed over 1-2 weeks. When bleeding starts later in pregnancy or the hematoma is larger or occurs in an older pregnant woman, the outcome may not be as good. Larger hematomas may get bigger, which increases the chances for miscarriage. Subchorionic hematoma also increases the risk of premature detachment of the placenta from the uterus, preterm (premature) labor, and stillbirth. HOME CARE INSTRUCTIONS  Stay on bed rest if your health care provider recommends this. Although bed rest will not prevent more bleeding or prevent a miscarriage, your health care provider may recommend bed rest until you are advised otherwise.  Avoid heavy lifting (more than 10 lb [4.5 kg]), exercise, sexual intercourse, or douching as directed by your health care provider.  Keep track of the number of pads you use each day and how soaked (saturated) they are. Write down this information.  Do not use tampons.  Keep all follow-up appointments as directed by your health care provider. Your health care provider may ask you to have follow-up blood tests or ultrasound tests or both. SEEK IMMEDIATE MEDICAL CARE IF:  You have severe cramps in your stomach, back, abdomen, or pelvis.  You have a fever.  You pass large clots or tissue. Save any tissue for your health  care provider to look at.  Your bleeding increases or you become lightheaded, feel weak, or have fainting episodes. Document Released: 08/29/2006 Document Revised: 09/28/2013 Document Reviewed: 12/11/2012 [facility] Patient Information 2015 [place], [place]. This information is not intended to replace advice given to you by your health care provider. Make sure you discuss any questions you have with your health care provider.  Pelvic Rest Pelvic rest is sometimes recommended for women when:   The placenta is partially or completely covering the opening of the cervix (placenta previa).  There is bleeding between the uterine wall and the amniotic sac in the first trimester (subchorionic hemorrhage).  The cervix begins to open without labor starting (incompetent cervix, cervical insufficiency).  The labor is too early (preterm labor). HOME CARE INSTRUCTIONS  Do not have sexual intercourse, stimulation, or an orgasm.  Do not use tampons, douche, or put anything in the vagina.  Do not lift anything over 10 pounds (4.5 kg).  Avoid strenuous activity or straining your pelvic muscles. SEEK MEDICAL CARE IF:  You have any vaginal bleeding during pregnancy. Treat this as a potential emergency.  You have cramping pain felt low in the stomach (stronger than menstrual cramps).  You notice vaginal discharge (watery, mucus, or bloody).  You have a low, dull backache.  There are regular contractions or uterine tightening. SEEK IMMEDIATE MEDICAL CARE IF: You have vaginal bleeding and have placenta previa.  Document Released: 09/08/2010 Document Revised: 08/06/2011 Document Reviewed: 09/08/2010 [facility] Patient Information 2015 [place], [place]. This information is not intended to replace advice given to you by your health care provider.  Make sure you discuss any questions you have with your health care provider. ° °

## 2014-11-12 NOTE — MAU Provider Note (Signed)
History     CSN: [id number]  Arrival date and time: 11/12/14 1154   First Provider Initiated Contact with Patient 11/12/14 1532      Chief Complaint  Patient presents with  . Vaginal Bleeding  . Abdominal Pain   HPI  Shelly Bradley is a 29 y.o. [phone] at [redacted]w[redacted]d who presents to MAU today with complaint of vaginal bleeding x 3 weeks. The patient had +UPT at Urgent Care last month prior to onset of bleeding. LMP unsure as patient was breastfeeding. She states bleeding has increased over the last 2 weeks but is consistently less than a light period. She changes a panty liner q 4 hours. She also states mild lower abdominal cramping worse on the left than the right. She has not been sexually active in ~ 2 months. She denies vaginal discharge, UTI symptoms today. She has had mild nausea without vomiting, diarrhea or constipation. She plans to go to [facility] for prenatal care but is waiting for Medicaid.   OB History    Gravida Para Term Preterm AB TAB SAB Ectopic Multiple Living   4 1 0 1 2 0 2 0 0 1       Past Medical History  Diagnosis Date  . Asthma   . Abnormal pap 2004  . Fibroid     Past Surgical History  Procedure Laterality Date  . Cervical biopsy  w/ loop electrode excision  2004    Family History  Problem Relation Age of Onset  . Diabetes Maternal Aunt   . Diabetes Maternal Grandmother     History  Substance Use Topics  . Smoking status: Never Smoker   . Smokeless tobacco: Never Used  . Alcohol Use: Yes     Comment: Social Use before preg    Allergies: No Known Allergies  No prescriptions prior to admission    Review of Systems  Constitutional: Negative for fever and malaise/fatigue.  Gastrointestinal: Positive for abdominal pain. Negative for nausea, vomiting, diarrhea and constipation.  Genitourinary: Negative for dysuria, urgency and frequency.       + vaginal bleeding Neg - vaginal discharge   Physical Exam   Blood pressure 116/67, pulse 93,  temperature 98.6 F (37 C), temperature source Oral, resp. rate 18, height 4' 11.5" (1.511 m), weight 151 lb (68.493 kg), not currently breastfeeding.  Physical Exam  Nursing note and vitals reviewed. Constitutional: She is oriented to person, place, and time. She appears well-developed and well-nourished. No distress.  HENT:  Head: Normocephalic and atraumatic.  Cardiovascular: Normal rate.   Respiratory: Effort normal.  GI: Soft. She exhibits no distension and no mass. There is no tenderness. There is no rebound and no guarding.  Genitourinary: Uterus is enlarged (appropriate for GA). Uterus is not tender. Cervix exhibits no motion tenderness, no discharge and no friability. There is bleeding (small dark red-brown blood noted in the vaginal vault) in the vagina.  Cervix: closed, thick  Neurological: She is alert and oriented to person, place, and time.  Skin: Skin is warm and dry. No erythema.  Psychiatric: She has a normal mood and affect.   Results for orders placed or performed during the hospital encounter of 11/12/14 (from the past 24 hour(s))  Urinalysis, Routine w reflex microscopic (not at [facility])     Status: Abnormal   Collection Time: 11/12/14 12:20 PM  Result Value Ref Range   Color, Urine YELLOW YELLOW   APPearance CLEAR CLEAR   Specific Gravity, Urine 1.020 1.005 - 1.030  pH 7.0 5.0 - 8.0   Glucose, UA NEGATIVE NEGATIVE mg/dL   Hgb urine dipstick LARGE (A) NEGATIVE   Bilirubin Urine NEGATIVE NEGATIVE   Ketones, ur NEGATIVE NEGATIVE mg/dL   Protein, ur NEGATIVE NEGATIVE mg/dL   Urobilinogen, UA 0.2 0.0 - 1.0 mg/dL   Nitrite NEGATIVE NEGATIVE   Leukocytes, UA SMALL (A) NEGATIVE  Urine microscopic-add on     Status: Abnormal   Collection Time: 11/12/14 12:20 PM  Result Value Ref Range   Squamous Epithelial / LPF FEW (A) RARE   WBC, UA 0-2 <3 WBC/hpf   Bacteria, UA FEW (A) RARE  CBC with Differential/Platelet     Status: Abnormal   Collection Time: 11/12/14  1:20 PM   Result Value Ref Range   WBC 9.8 4.0 - 10.5 K/uL   RBC 4.64 3.87 - 5.11 MIL/uL   Hemoglobin 12.0 12.0 - 15.0 g/dL   HCT 36.0 36.0 - 46.0 %   MCV 77.6 (L) 78.0 - 100.0 fL   MCH 25.9 (L) 26.0 - 34.0 pg   MCHC 33.3 30.0 - 36.0 g/dL   RDW 15.1 11.5 - 15.5 %   Platelets 207 150 - 400 K/uL   Neutrophils Relative % 70 43 - 77 %   Neutro Abs 6.8 1.7 - 7.7 K/uL   Lymphocytes Relative 22 12 - 46 %   Lymphs Abs 2.2 0.7 - 4.0 K/uL   Monocytes Relative 7 3 - 12 %   Monocytes Absolute 0.7 0.1 - 1.0 K/uL   Eosinophils Relative 1 0 - 5 %   Eosinophils Absolute 0.1 0.0 - 0.7 K/uL   Basophils Relative 0 0 - 1 %   Basophils Absolute 0.0 0.0 - 0.1 K/uL  hCG, quantitative, pregnancy     Status: Abnormal   Collection Time: 11/12/14  1:20 PM  Result Value Ref Range   hCG, Beta Chain, Quant, S 100783 (H) <5 mIU/mL  Wet prep, genital     Status: Abnormal   Collection Time: 11/12/14  3:55 PM  Result Value Ref Range   Yeast Wet Prep HPF POC NONE SEEN NONE SEEN   Trich, Wet Prep NONE SEEN NONE SEEN   Clue Cells Wet Prep HPF POC FEW (A) NONE SEEN   WBC, Wet Prep HPF POC MODERATE (A) NONE SEEN   US Ob Comp Less 14 Wks  11/12/2014   CLINICAL DATA:  Vaginal bleeding for several weeks.  EXAM: OBSTETRIC <14 WK ULTRASOUND  TECHNIQUE: Transvaginal ultrasound was performed for complete evaluation of the gestation as well as the maternal uterus, adnexal regions, and pelvic cul-de-sac.  COMPARISON:  None.  FINDINGS: Intrauterine gestational sac: Single  Yolk sac:  Not identified  Embryo:  Present  Cardiac Activity: Present  Heart Rate: 169 bpm  CRL:   38  mm   10 w 5 d                  [place] EDC: 06/15/2015  Maternal uterus/adnexae: Small subchorionic hemorrhage. Normal ovaries. No free fluid  IMPRESSION: 1. Single intrauterine gestation with embryo and normal cardiac activity.  2. Estimated gestational age by crown rump length equals 10 weeks 5 days.  3.  Small subchorionic hemorrhage.   Electronically Signed   By:  Suzy Bouchard M.D.   On: 11/12/2014 14:18    MAU Course  Procedures None  MDM Recent +UPT at [facility] CBC, quant hCG, wet prep, GC/Chlamydia, HIV, RPR and [place] today  Assessment and Plan  A: [facility] at [redacted]w[redacted]d  with normal cardiac activity Small subchorionic hemorrhage  P: Discharge home Bleeding precautions discussed Patient advised to follow-up with [facility] as scheduled to start prenatal care Patient may return to MAU as needed or if her condition were to change or worsen   Luvenia Redden, PA-C  11/12/2014, 4:53 PM

## 2014-11-13 LAB — RPR: RPR: NONREACTIVE

## 2014-11-13 LAB — HIV ANTIBODY (ROUTINE TESTING W REFLEX): HIV SCREEN 4TH GENERATION: NONREACTIVE

## 2014-11-15 LAB — GC/CHLAMYDIA PROBE AMP (~~LOC~~) NOT AT ARMC
CHLAMYDIA, DNA PROBE: NEGATIVE
Neisseria Gonorrhea: NEGATIVE

## 2015-01-15 ENCOUNTER — Encounter (HOSPITAL_COMMUNITY): Payer: Self-pay | Admitting: *Deleted

## 2015-01-15 ENCOUNTER — Inpatient Hospital Stay (HOSPITAL_COMMUNITY): Payer: Medicaid Other

## 2015-01-15 ENCOUNTER — Inpatient Hospital Stay (HOSPITAL_COMMUNITY)
Admission: AD | Admit: 2015-01-15 | Discharge: 2015-01-15 | Disposition: A | Discharge status: Home / Self Care | Payer: Medicaid Other | Source: Ambulatory Visit | Attending: Obstetrics and Gynecology | Admitting: Obstetrics and Gynecology

## 2015-01-15 DIAGNOSIS — O9989 Other specified diseases and conditions complicating pregnancy, childbirth and the puerperium: Secondary | ICD-10-CM | POA: Diagnosis not present

## 2015-01-15 DIAGNOSIS — O09299 Supervision of pregnancy with other poor reproductive or obstetric history, unspecified trimester: Secondary | ICD-10-CM

## 2015-01-15 DIAGNOSIS — R109 Unspecified abdominal pain: Secondary | ICD-10-CM | POA: Diagnosis not present

## 2015-01-15 DIAGNOSIS — O0932 Supervision of pregnancy with insufficient antenatal care, second trimester: Secondary | ICD-10-CM | POA: Diagnosis not present

## 2015-01-15 DIAGNOSIS — Z3A19 19 weeks gestation of pregnancy: Secondary | ICD-10-CM | POA: Diagnosis not present

## 2015-01-15 DIAGNOSIS — O26899 Other specified pregnancy related conditions, unspecified trimester: Secondary | ICD-10-CM

## 2015-01-15 DIAGNOSIS — R1030 Lower abdominal pain, unspecified: Secondary | ICD-10-CM | POA: Diagnosis present

## 2015-01-15 LAB — URINALYSIS, ROUTINE W REFLEX MICROSCOPIC
Bilirubin Urine: NEGATIVE
Glucose, UA: NEGATIVE mg/dL
Hgb urine dipstick: NEGATIVE
Ketones, ur: NEGATIVE mg/dL
LEUKOCYTES UA: NEGATIVE
NITRITE: NEGATIVE
PROTEIN: NEGATIVE mg/dL
Specific Gravity, Urine: 1.025 (ref 1.005–1.030)
Urobilinogen, UA: 0.2 mg/dL (ref 0.0–1.0)
pH: 6 (ref 5.0–8.0)

## 2015-01-15 LAB — CBC
HCT: 31.4 % — ABNORMAL LOW (ref 36.0–46.0)
HEMOGLOBIN: 10.3 g/dL — AB (ref 12.0–15.0)
MCH: 25.9 pg — AB (ref 26.0–34.0)
MCHC: 32.8 g/dL (ref 30.0–36.0)
MCV: 78.9 fL (ref 78.0–100.0)
Platelets: 182 10*3/uL (ref 150–400)
RBC: 3.98 MIL/uL (ref 3.87–5.11)
RDW: 14.7 % (ref 11.5–15.5)
WBC: 10.6 10*3/uL — ABNORMAL HIGH (ref 4.0–10.5)

## 2015-01-15 LAB — WET PREP, GENITAL
TRICH WET PREP: NONE SEEN
Yeast Wet Prep HPF POC: NONE SEEN

## 2015-01-15 LAB — OB RESULTS CONSOLE GC/CHLAMYDIA
CHLAMYDIA, DNA PROBE: NEGATIVE
Gonorrhea: NEGATIVE
Gonorrhea: NEGATIVE

## 2015-01-15 MED ORDER — PROGESTERONE MICRONIZED 200 MG PO CAPS: 200.0000 mg | ORAL_CAPSULE | Freq: Every day | ORAL | Status: DC

## 2015-01-15 MED ORDER — IBUPROFEN 600 MG PO TABS
600.0000 mg | ORAL_TABLET | Freq: Four times a day (QID) | ORAL | Status: DC | PRN
  Administered 2015-01-15: 600 mg via ORAL
  Filled 2015-01-15: qty 1

## 2015-01-15 MED ORDER — METRONIDAZOLE 500 MG PO TABS: 500.0000 mg | ORAL_TABLET | Freq: Two times a day (BID) | ORAL | Status: AC

## 2015-01-15 MED ORDER — IBUPROFEN 600 MG PO TABS
600.0000 mg | ORAL_TABLET | Freq: Four times a day (QID) | ORAL | Status: AC | PRN
Start: 2015-01-15 — End: 2015-01-17

## 2015-01-15 NOTE — MAU Note (Signed)
Pt presents to MAU with complaints of pressure in her lower abdomen. Denies any vaginal bleeding or abnormal discharge

## 2015-01-15 NOTE — MAU Provider Note (Signed)
History   29 yo [id number] at 190 6/7 weeks by [place] in MAU at 10 5/7 weeks presented unannounced c/o lower abdominal pain that has worsened this week.  No prenatal care to this point--previous patient of [facility] with 2015 pregnancy, when she delivered at 35 3/7 weeks with PPROM and PTD.  Plans care with [facility] "when insurance is settled".  Denies N/V, diarrhea, constipation, dysuria, epigastric pain, d/c, or current bleeding.  Had small [place] at 10 weeks, with vaginal bleeding in early pregnancy.  Notes lower abdomen is painful to touch, just below umbilicus, more on left than right.  Reports occasional cramping, but differentiates this pain from cramping.  Patient Active Problem List   Diagnosis Date Noted  . Premature delivery before 37 weeks 04/05/2014  . Preterm premature rupture of membranes (PPROM) with onset of labor within 24 hours of rupture in third trimester, antepartum 04/04/2014  . Vaginal delivery 04/04/2014  . Pelvic pain in female 05/05/2012  . Fibroids 05/05/2012    Chief Complaint  Patient presents with  . Abdominal Cramping   HPI:  See above  OB History    Gravida Para Term Preterm AB TAB SAB Ectopic Multiple Living   4 1 0 1 2 0 2 0 0 1       Past Medical History  Diagnosis Date  . Asthma   . Abnormal pap 2004  . Fibroid     Past Surgical History  Procedure Laterality Date  . Cervical biopsy  w/ loop electrode excision  2004    Family History  Problem Relation Age of Onset  . Diabetes Maternal Aunt   . Diabetes Maternal Grandmother     Social History  Substance Use Topics  . Smoking status: Never Smoker   . Smokeless tobacco: Never Used  . Alcohol Use: Yes     Comment: Social Use before preg    Allergies: No Known Allergies  Prescriptions prior to admission  Medication Sig Dispense Refill Last Dose  . albuterol (PROVENTIL HFA;VENTOLIN HFA) 108 (90 BASE) MCG/ACT inhaler Inhale 1-2 puffs into the lungs every 6 (six) hours as needed for wheezing or  shortness of breath. 1 Inhaler 3 rescue  . Prenatal Vit-Fe Fumarate-FA (PRENATAL MULTIVITAMIN) TABS tablet Take 1 tablet by mouth daily at 12 noon.   Past Week at Unknown time    ROS:  Abdominal pain to palpation, +FM Physical Exam   Blood pressure 132/78, pulse 90, temperature 98.2 F (36.8 C), resp. rate 18, height 5\' 2"  (1.575 m), weight 69.854 kg (154 lb), not currently breastfeeding.    Physical Exam  Has mild/moderate discomfort with movement or palpation of abdomen. No guarding/rebound noted. Chest clear Heart RRR without murmur Abd soft, moderate tenderness just below umbilicus, more on left than right.  No evidence of hernia, no rebound or guarding. Uterus soft to palpation. Pelvic--small amount white d/c in vault, cervix closed, long. Ext WNL  ED Course  Assessment: IUP at 19 6/7 weeks by 10 5/7 week [place] No prenatal care--plans care with [facility] "when insurance comes through" Hx previous PTD at 35 weeks, with PPROM. Hx fibroids Abdominal pain, likely musculoskeletal  Plan: CBC UA Wet prep, GC/chlamydia Limited OB US for cervical and placenta assessment. Ibuprophen 600 mg po now--plan OTC use of 600 mg po q 6 hours x 24-48 hours.   Donnel Saxon CNM, MSN 01/15/2015 6:34 PM   MAU Addendum Note  Results for orders placed or performed during the hospital encounter of 01/15/15 (from the past 24  hour(s))  Urinalysis, Routine w reflex microscopic (not at [facility])     Status: None   Collection Time: 01/15/15  5:45 PM  Result Value Ref Range   Color, Urine YELLOW YELLOW   APPearance CLEAR CLEAR   Specific Gravity, Urine 1.025 1.005 - 1.030   pH 6.0 5.0 - 8.0   Glucose, UA NEGATIVE NEGATIVE mg/dL   Hgb urine dipstick NEGATIVE NEGATIVE   Bilirubin Urine NEGATIVE NEGATIVE   Ketones, ur NEGATIVE NEGATIVE mg/dL   Protein, ur NEGATIVE NEGATIVE mg/dL   Urobilinogen, UA 0.2 0.0 - 1.0 mg/dL   Nitrite NEGATIVE NEGATIVE   Leukocytes, UA NEGATIVE NEGATIVE  Wet prep, genital      Status: Abnormal   Collection Time: 01/15/15  6:40 PM  Result Value Ref Range   Yeast Wet Prep HPF POC NONE SEEN NONE SEEN   Trich, Wet Prep NONE SEEN NONE SEEN   Clue Cells Wet Prep HPF POC FEW (A) NONE SEEN   WBC, Wet Prep HPF POC MANY (A) NONE SEEN  CBC     Status: Abnormal   Collection Time: 01/15/15  6:53 PM  Result Value Ref Range   WBC 10.6 (H) 4.0 - 10.5 K/uL   RBC 3.98 3.87 - 5.11 MIL/uL   Hemoglobin 10.3 (L) 12.0 - 15.0 g/dL   HCT 31.4 (L) 36.0 - 46.0 %   MCV 78.9 78.0 - 100.0 fL   MCH 25.9 (L) 26.0 - 34.0 pg   MCHC 32.8 30.0 - 36.0 g/dL   RDW 14.7 11.5 - 15.5 %   Platelets 182 150 - 400 K/uL   [place]  FHR 706, cephalic, anterior placenta above the os, AFI wnl, larges pocket 5.6, cervical length 1.8cm, funneling of internal os noted,  Plan: -Rx for flagyl, 17P, & IBP -Discussed need to follow up in office or call Faculty practice on Monday or Tuesday to establish OB care and to FU [place] for cervical length  -Bleeding and PTL Precautions -Encouraged to call if any questions or concerns arise prior to next scheduled office visit.  -pelvic rest -discussed not working until FU [place] to determine cervical status -discussed possible cerclage -Discharged to home in stable condition Consulted with Dr. Darci Current Corynn Solberg, CNM, MSN 01/15/2015. 8:17 PM

## 2015-01-15 NOTE — Discharge Instructions (Signed)
Abdominal Pain Many things can cause abdominal pain. Usually, abdominal pain is not caused by a disease and will improve without treatment. It can often be observed and treated at home. Your health care provider will do a physical exam and possibly order blood tests and X-rays to help determine the seriousness of your pain. However, in many cases, more time must pass before a clear cause of the pain can be found. Before that point, your health care provider may not know if you need more testing or further treatment. HOME CARE INSTRUCTIONS  Monitor your abdominal pain for any changes. The following actions may help to alleviate any discomfort you are experiencing:  Only take over-the-counter or prescription medicines as directed by your health care provider.  Do not take laxatives unless directed to do so by your health care provider.  Try a clear liquid diet (broth, tea, or water) as directed by your health care provider. Slowly move to a bland diet as tolerated. SEEK MEDICAL CARE IF:  You have unexplained abdominal pain.  You have abdominal pain associated with nausea or diarrhea.  You have pain when you urinate or have a bowel movement.  You experience abdominal pain that wakes you in the night.  You have abdominal pain that is worsened or improved by eating food.  You have abdominal pain that is worsened with eating fatty foods.  You have a fever. SEEK IMMEDIATE MEDICAL CARE IF:   Your pain does not go away within 2 hours.  You keep throwing up (vomiting).  Your pain is felt only in portions of the abdomen, such as the right side or the left lower portion of the abdomen.  You pass bloody or black tarry stools. MAKE SURE YOU:  Understand these instructions.   Will watch your condition.   Will get help right away if you are not doing well or get worse.  Document Released: 02/21/2005 Document Revised: 05/19/2013 Document Reviewed: 01/21/2013 [facility] Patient  Information 2015 [place], [place]. This information is not intended to replace advice given to you by your health care provider. Make sure you discuss any questions you have with your health care provider. Abnormal Uterine Bleeding Abnormal uterine bleeding can affect women at various stages in life, including teenagers, women in their reproductive years, pregnant women, and women who have reached menopause. Several kinds of uterine bleeding are considered abnormal, including:  Bleeding or spotting between periods.   Bleeding after sexual intercourse.   Bleeding that is heavier or more than normal.   Periods that last longer than usual.  Bleeding after menopause.  Many cases of abnormal uterine bleeding are minor and simple to treat, while others are more serious. Any type of abnormal bleeding should be evaluated by your health care provider. Treatment will depend on the cause of the bleeding. HOME CARE INSTRUCTIONS Monitor your condition for any changes. The following actions may help to alleviate any discomfort you are experiencing:  Avoid the use of tampons and douches as directed by your health care provider.  Change your pads frequently. You should get regular pelvic exams and Pap tests. Keep all follow-up appointments for diagnostic tests as directed by your health care provider.  SEEK MEDICAL CARE IF:   Your bleeding lasts more than 1 week.   You feel dizzy at times.  SEEK IMMEDIATE MEDICAL CARE IF:   You pass out.   You are changing pads every 15 to 30 minutes.   You have abdominal pain.  You have a fever.  You become sweaty or weak.   You are passing large blood clots from the vagina.   You start to feel nauseous and vomit. MAKE SURE YOU:   Understand these instructions.  Will watch your condition.  Will get help right away if you are not doing well or get worse. Document Released: 05/14/2005 Document Revised: 05/19/2013 Document Reviewed:  12/11/2012 [facility] Patient Information 2015 [place], [place]. This information is not intended to replace advice given to you by your health care provider. Make sure you discuss any questions you have with your health care provider. Pelvic Rest Pelvic rest is sometimes recommended for women when:   The placenta is partially or completely covering the opening of the cervix (placenta previa).  There is bleeding between the uterine wall and the amniotic sac in the first trimester (subchorionic hemorrhage).  The cervix begins to open without labor starting (incompetent cervix, cervical insufficiency).  The labor is too early (preterm labor). HOME CARE INSTRUCTIONS  Do not have sexual intercourse, stimulation, or an orgasm.  Do not use tampons, douche, or put anything in the vagina.  Do not lift anything over 10 pounds (4.5 kg).  Avoid strenuous activity or straining your pelvic muscles. SEEK MEDICAL CARE IF:  You have any vaginal bleeding during pregnancy. Treat this as a potential emergency.  You have cramping pain felt low in the stomach (stronger than menstrual cramps).  You notice vaginal discharge (watery, mucus, or bloody).  You have a low, dull backache.  There are regular contractions or uterine tightening. SEEK IMMEDIATE MEDICAL CARE IF: You have vaginal bleeding and have placenta previa.  Document Released: 09/08/2010 Document Revised: 08/06/2011 Document Reviewed: 09/08/2010 [facility] Patient Information 2015 [place], [place]. This information is not intended to replace advice given to you by your health care provider. Make sure you discuss any questions you have with your health care provider.

## 2015-01-16 LAB — HIV ANTIBODY (ROUTINE TESTING W REFLEX): HIV Screen 4th Generation wRfx: NONREACTIVE

## 2015-01-17 LAB — GC/CHLAMYDIA PROBE AMP (~~LOC~~) NOT AT ARMC
Chlamydia: NEGATIVE
Neisseria Gonorrhea: NEGATIVE

## 2015-02-09 ENCOUNTER — Other Ambulatory Visit: Payer: Self-pay | Admitting: Obstetrics & Gynecology

## 2015-02-10 ENCOUNTER — Encounter (HOSPITAL_COMMUNITY): Payer: Self-pay | Admitting: General Practice

## 2015-02-10 ENCOUNTER — Ambulatory Visit (HOSPITAL_COMMUNITY): Payer: Medicaid Other | Admitting: Anesthesiology

## 2015-02-10 ENCOUNTER — Encounter (HOSPITAL_COMMUNITY)
Admission: RE | Disposition: A | Discharge status: Home / Self Care | Payer: Self-pay | Source: Ambulatory Visit | Attending: Obstetrics & Gynecology

## 2015-02-10 ENCOUNTER — Other Ambulatory Visit (HOSPITAL_COMMUNITY): Payer: Self-pay | Admitting: *Deleted

## 2015-02-10 ENCOUNTER — Ambulatory Visit (HOSPITAL_COMMUNITY)
Admission: RE | Admit: 2015-02-10 | Discharge: 2015-02-10 | Disposition: A | Discharge status: Home / Self Care | Payer: Medicaid Other | Source: Ambulatory Visit | Attending: Obstetrics & Gynecology | Admitting: Obstetrics & Gynecology

## 2015-02-10 DIAGNOSIS — O09212 Supervision of pregnancy with history of pre-term labor, second trimester: Secondary | ICD-10-CM | POA: Diagnosis not present

## 2015-02-10 DIAGNOSIS — O99512 Diseases of the respiratory system complicating pregnancy, second trimester: Secondary | ICD-10-CM | POA: Insufficient documentation

## 2015-02-10 DIAGNOSIS — Z3A23 23 weeks gestation of pregnancy: Secondary | ICD-10-CM | POA: Insufficient documentation

## 2015-02-10 DIAGNOSIS — O26872 Cervical shortening, second trimester: Secondary | ICD-10-CM | POA: Diagnosis not present

## 2015-02-10 DIAGNOSIS — J45909 Unspecified asthma, uncomplicated: Secondary | ICD-10-CM | POA: Insufficient documentation

## 2015-02-10 DIAGNOSIS — O3430 Maternal care for cervical incompetence, unspecified trimester: Secondary | ICD-10-CM

## 2015-02-10 HISTORY — PX: CERVICAL CERCLAGE: SHX1329

## 2015-02-10 LAB — CBC
HCT: 29 % — ABNORMAL LOW (ref 36.0–46.0)
Hemoglobin: 9.3 g/dL — ABNORMAL LOW (ref 12.0–15.0)
MCH: 25.1 pg — AB (ref 26.0–34.0)
MCHC: 32.1 g/dL (ref 30.0–36.0)
MCV: 78.4 fL (ref 78.0–100.0)
PLATELETS: 176 10*3/uL (ref 150–400)
RBC: 3.7 MIL/uL — ABNORMAL LOW (ref 3.87–5.11)
RDW: 14.6 % (ref 11.5–15.5)
WBC: 9.1 10*3/uL (ref 4.0–10.5)

## 2015-02-10 SURGERY — CERCLAGE, CERVIX, VAGINAL APPROACH
Anesthesia: Spinal | Site: Cervix

## 2015-02-10 MED ORDER — INDOMETHACIN 25 MG PO CAPS
25.0000 mg | ORAL_CAPSULE | Freq: Once | ORAL | Status: AC
  Administered 2015-02-10: 25 mg via ORAL
  Filled 2015-02-10: qty 1

## 2015-02-10 MED ORDER — LACTATED RINGERS IV SOLN
INTRAVENOUS | Status: DC
  Administered 2015-02-10 (×5): via INTRAVENOUS

## 2015-02-10 MED ORDER — SCOPOLAMINE 1 MG/3DAYS TD PT72
MEDICATED_PATCH | TRANSDERMAL | Status: AC
  Filled 2015-02-10: qty 1

## 2015-02-10 MED ORDER — BUPIVACAINE-EPINEPHRINE (PF) 0.5% -1:200000 IJ SOLN
INTRAMUSCULAR | Status: AC
  Filled 2015-02-10: qty 30

## 2015-02-10 MED ORDER — OXYCODONE-ACETAMINOPHEN 5-325 MG PO TABS: 1.0000 | ORAL_TABLET | ORAL | Status: DC | PRN

## 2015-02-10 MED ORDER — FENTANYL CITRATE (PF) 100 MCG/2ML IJ SOLN
INTRAMUSCULAR | Status: AC
  Filled 2015-02-10: qty 2

## 2015-02-10 MED ORDER — SCOPOLAMINE 1 MG/3DAYS TD PT72
1.0000 | MEDICATED_PATCH | Freq: Once | TRANSDERMAL | Status: DC
  Administered 2015-02-10: 1.5 mg via TRANSDERMAL

## 2015-02-10 MED ORDER — LACTATED RINGERS IV BOLUS (SEPSIS)
500.0000 mL | Freq: Once | INTRAVENOUS | Status: AC
  Administered 2015-02-10: 500 mL via INTRAVENOUS

## 2015-02-10 MED ORDER — FENTANYL CITRATE (PF) 100 MCG/2ML IJ SOLN
25.0000 ug | INTRAMUSCULAR | Status: DC | PRN
  Administered 2015-02-10: 50 ug via INTRAVENOUS
  Administered 2015-02-10: 25 ug via INTRAVENOUS

## 2015-02-10 MED ORDER — BUPIVACAINE IN DEXTROSE 0.75-8.25 % IT SOLN
INTRATHECAL | Status: DC | PRN
  Administered 2015-02-10: 1 mL via INTRATHECAL

## 2015-02-10 MED ORDER — PROMETHAZINE HCL 25 MG/ML IJ SOLN
6.2500 mg | INTRAMUSCULAR | Status: DC | PRN
Start: 2015-02-10 — End: 2015-02-11

## 2015-02-10 SURGICAL SUPPLY — 20 items
CLOTH BEACON ORANGE TIMEOUT ST (SAFETY) ×3 IMPLANT
COUNTER NEEDLE 1200 MAGNETIC (NEEDLE) IMPLANT
GLOVE BIO SURGEON STRL SZ 6.5 (GLOVE) ×2 IMPLANT
GLOVE BIO SURGEONS STRL SZ 6.5 (GLOVE) ×1
GLOVE BIOGEL PI IND STRL 6.5 (GLOVE) ×1 IMPLANT
GLOVE BIOGEL PI INDICATOR 6.5 (GLOVE) ×2
GOWN STRL REUS W/TWL LRG LVL3 (GOWN DISPOSABLE) ×6 IMPLANT
NEEDLE MAYO .5 CIRCLE (NEEDLE) IMPLANT
NS IRRIG 1000ML POUR BTL (IV SOLUTION) ×3 IMPLANT
PACK VAGINAL MINOR WOMEN LF (CUSTOM PROCEDURE TRAY) ×3 IMPLANT
PAD OB MATERNITY 4.3X12.25 (PERSONAL CARE ITEMS) ×3 IMPLANT
PAD PREP 24X48 CUFFED NSTRL (MISCELLANEOUS) ×3 IMPLANT
SUT MERSILENE 5MM BP 1 12 (SUTURE) ×3 IMPLANT
SUT PROLENE 1 CT 1 30 (SUTURE) ×3 IMPLANT
TOWEL OR 17X24 6PK STRL BLUE (TOWEL DISPOSABLE) ×6 IMPLANT
TRAY FOLEY CATH SILVER 14FR (SET/KITS/TRAYS/PACK) ×3 IMPLANT
TUBING NON-CON 1/4 X 20 CONN (TUBING) IMPLANT
TUBING NON-CON 1/4 X 20' CONN (TUBING)
WATER STERILE IRR 1000ML POUR (IV SOLUTION) ×3 IMPLANT
YANKAUER SUCT BULB TIP NO VENT (SUCTIONS) IMPLANT

## 2015-02-10 NOTE — Brief Op Note (Signed)
02/10/2015  4:36 PM  PATIENT:  Shelly Bradley  29 y.o. female  PRE-OPERATIVE DIAGNOSIS:  History of Preterm Labor, Shortened Cervix  POST-OPERATIVE DIAGNOSIS:  History of Preterm Labor, Shortened Cervix  PROCEDURE:  Procedure(s): CERCLAGE CERVICAL (N/A)  SURGEON:  Surgeon(s) and Role:    * Waymon Amato, MD - Primary  ANESTHESIA:   spinal  EBL:  Total I/O In: 2000 [I.V.:2000] Out: 50 [Blood:50]  BLOOD ADMINISTERED:none  DRAINS: none   LOCAL MEDICATIONS USED:  NONE  SPECIMEN:  No Specimen  DISPOSITION OF SPECIMEN:  N/A  COUNTS:  YES  TOURNIQUET:  * No tourniquets in log *  DICTATION: .Dragon Dictation  PLAN OF CARE: Discharge to home after PACU  PATIENT DISPOSITION:  PACU - hemodynamically stable.   Delay start of Pharmacological VTE agent (>24hrs) due to surgical blood loss or risk of bleeding: not applicable

## 2015-02-10 NOTE — H&P (Signed)
Shelly Bradley is a 29 y.o. female presenting for a cerclage: she has a short  cervix and history of preterm labor and delivery.  History OB History    Gravida Para Term Preterm AB TAB SAB Ectopic Multiple Living   4 1 0 1 2 0 2 0 0 1      Past Medical History  Diagnosis Date  . Asthma   . Abnormal pap 2004  . Fibroid    Past Surgical History  Procedure Laterality Date  . Cervical biopsy  w/ loop electrode excision  2004   Family History: family history includes Diabetes in her maternal aunt and maternal grandmother. Social History:  reports that she has never smoked. She has never used smokeless tobacco. She reports that she drinks alcohol. She reports that she does not use illicit drugs.   Prenatal Transfer Tool  Maternal Diabetes: No Genetic Screening: Done today.  Maternal Ultrasounds/Referrals: Ultrasound done 9/14 showed cervical length of 0.91mm-13.7 mm.  Fetal Ultrasounds or other Referrals:  Other:  Maternal Substance Abuse:  No Significant Maternal Medications:  None Significant Maternal Lab Results:  None Other Comments:    ROS Constitutional: Denies fevers or chills  Cardiovascular system: Denies chest pain or tightness of breath or palpitations  Pulmonary system: Denies coughing or wheezing  Gastrointestinal system: Denies nausea or vomiting or diarrhea  Musculoskeletal system: Denies muscle pain or joint pain  Neurology system: Denies motor or sensory deficits     Exam  Gen.: No acute distress  CVS: S1-S2 regular rate and rhythm  Pulmonary: Clear to auscultation bilaterally  Abdomen: Soft, gravid nontender  Extremities: Warm and well perfused no edema no calf tenderness Physical Exam   Prenatal labs: ABO, Rh: --/--/A POS (11/08 2202) Antibody: NEG (11/08 0655) Rubella:  Immune  RPR: Non Reactive (06/17 1320)  HBsAg:  NR  HIV:   NR GBS: Negative (11/03 0000)   Assessment/Plan: [id number] @ 63 W 54 Dayss EGA with shots cervix and also a  history of preterm labor now for a cerclage Shirodkar  Discussed risks and benefits and alternatives of the procedure including risks of bleeding infection damage to organs. We also discussed risk of preterm premature rupture of membranes and an accompanying risk of premature labor and delivery. All questions were answered and patient agreed to the procedure. [facility] 02/10/2015, 11:59 AM

## 2015-02-10 NOTE — Transfer of Care (Signed)
Immediate Anesthesia Transfer of Care Note  Patient: Shelly Bradley  Procedure(s) Performed: Procedure(s): CERCLAGE CERVICAL (N/A)  Patient Location: PACU  Anesthesia Type:Spinal  Level of Consciousness: awake, alert  and oriented  Airway & Oxygen Therapy: Patient Spontanous Breathing  Post-op Assessment: Report given to RN and Post -op Vital signs reviewed and stable  Post vital signs: Reviewed and stable  Last Vitals:  Filed Vitals:   02/10/15 1325  BP: 118/72  Pulse: 90  Temp: 36.8 C  Resp: 18    Complications: No apparent anesthesia complications

## 2015-02-10 NOTE — Op Note (Addendum)
  Patient: Shelly Bradley, Shelly Bradley. DOB: 1986-05-07   Preop diagnosis:  1. History of preterm labor. 2. Short cervix. 3. 23 week and 4 day EGA intrauterine pregnancy  Postop diagnosis: Same as above  Procedure: McDonald's cervical cerclage  Surgeon: Dr. Waymon Amato  Assistants: Scrub technician Crystal  Anesthesia: Spinal  Complications: None  IV fluid: 2000 mL LR  EBL: 50 mL  Indications: 29 year old G4 para 0121 at 23 weeks and 4 days with a history of preterm labor who was found to have a short cervix on ultrasound:  On 9/14 cervix measured 9.6 mm to 13.7 mm. Patient desired a cervical cerclage placement.   Procedures:   Informed consent was obtained from the patient after explaining the risks benefits and alternatives of the procedure.   We also discussed risks of of pre-mature preterm rupture of membranes and accompanying risks of preterm labor.  She was taken to the operating room where spinal anesthesia was administered.  She was prepped and draped in the usual sterile fashion.  An exam done revealed a closed cervix with about 60% effacement.  There were no palpable adnexal masses.  A weighted speculum and retractor was placed in the vagina. The cervix was noted to be visually closed and about 1 cm long.  The anterior and posterior lips of the cervix was grasped with ring forceps. The 1-0 prolene suture was used to place a McDonald cerclage in purse-string suture around the cervix in the usual fashion at the cervical-vaginal junction. About 4 bites were taken around the cervix and the suture was tied anteriorly in 6 square stitches. Excellent hemostasis was noted at the end of this procedure. The instruments were then removed and the patient was cleaned. Count was correct. She was taken to the recovery room in stable condition  Specimen: None.

## 2015-02-10 NOTE — Discharge Instructions (Signed)
May restart vaginal progesterone in 2 days  Nothing in the vagina for the next week until you have your office visits  Expect some small vaginal bleeding, call if with excessive bleeding or leakage of fluid or severe abdominal cramping   Take pain medication as needed.   Avoid activities that involve heavy lifting or prolonged time standing on feet.

## 2015-02-10 NOTE — Anesthesia Postprocedure Evaluation (Signed)
Anesthesia Post Note  Patient: Shelly Bradley  Procedure(s) Performed: Procedure(s) (LRB): CERCLAGE CERVICAL (N/A)  Anesthesia type: Spinal  Patient location: PACU  Post pain: Pain level controlled  Post assessment: Post-op Vital signs reviewed  Last Vitals:  Filed Vitals:   02/10/15 1325  BP: 118/72  Pulse: 90  Temp: 36.8 C  Resp: 18    Post vital signs: Reviewed  Level of consciousness: awake  Complications: No apparent anesthesia complications

## 2015-02-10 NOTE — Progress Notes (Signed)
Spoke with Dr. Alesia Richards. Pt has had 4 uc's lasting 30-40 sec. She says she feels these as tightning which she rates as a 4. Pt has had 2 liters of IV fluids in the OR. Orders received for a 566ml bolus of LR and 25mg  of indomethacin po.

## 2015-02-10 NOTE — Interval H&P Note (Signed)
History and Physical Interval Note:  02/10/2015 2:59 PM  Shelly Bradley  has presented today for surgery, with the diagnosis of History of Preterm Labor, Shortened Cervix  The various methods of treatment have been discussed with the patient and family. After consideration of risks, benefits and other options for treatment, the patient has consented to  Procedure(s): CERCLAGE CERVICAL (N/A) as a surgical intervention .  The patient's history has been reviewed, patient examined, no change in status, stable for surgery.  I have reviewed the patient's chart and labs.  Questions were answered to the patient's satisfaction.     [facility]

## 2015-02-10 NOTE — Anesthesia Procedure Notes (Signed)
Spinal Patient location during procedure: OR Start time: 02/10/2015 3:08 PM End time: 02/10/2015 3:10 PM Staffing Anesthesiologist: Lyn Hollingshead Performed by: anesthesiologist  Preanesthetic Checklist Completed: patient identified, surgical consent, pre-op evaluation, timeout performed, IV checked, risks and benefits discussed and monitors and equipment checked Spinal Block Patient position: sitting Prep: DuraPrep Patient monitoring: heart rate, cardiac monitor, continuous pulse ox and blood pressure Approach: midline Location: L3-4 Injection technique: single-shot Needle Needle type: Pencan  Needle gauge: 24 G Needle length: 9 cm Needle insertion depth: 4 cm Assessment Sensory level: T4

## 2015-02-10 NOTE — Anesthesia Preprocedure Evaluation (Signed)
Anesthesia Evaluation  Patient identified by MRN, date of birth, ID band Patient awake    Reviewed: Allergy & Precautions, H&P , NPO status , Patient's Chart, lab work & pertinent test results  Airway Mallampati: II  TM Distance: >3 FB Neck ROM: full    Dental no notable dental hx.    Pulmonary asthma ,    Pulmonary exam normal breath sounds clear to auscultation       Cardiovascular negative cardio ROS Normal cardiovascular exam Rhythm:regular Rate:Normal     Neuro/Psych negative neurological ROS  negative psych ROS   GI/Hepatic negative GI ROS, Neg liver ROS,   Endo/Other  negative endocrine ROS  Renal/GU negative Renal ROS  negative genitourinary   Musculoskeletal   Abdominal   Peds  Hematology negative hematology ROS (+)   Anesthesia Other Findings   Reproductive/Obstetrics (+) Pregnancy                             Anesthesia Physical Anesthesia Plan  ASA: II  Anesthesia Plan: Spinal   Post-op Pain Management:    Induction:   Airway Management Planned: Nasal Cannula  Additional Equipment:   Intra-op Plan:   Post-operative Plan:   Informed Consent: I have reviewed the patients History and Physical, chart, labs and discussed the procedure including the risks, benefits and alternatives for the proposed anesthesia with the patient or authorized representative who has indicated his/her understanding and acceptance.     Plan Discussed with: Anesthesiologist, CRNA and Surgeon  Anesthesia Plan Comments: (Cervical cerclage for shortened cervix adn hx of preterm labor, plan for spinal)        Anesthesia Quick Evaluation

## 2015-02-11 ENCOUNTER — Encounter (HOSPITAL_COMMUNITY): Payer: Self-pay | Admitting: Obstetrics & Gynecology

## 2015-02-17 LAB — MISCELLANEOUS TEST

## 2015-02-18 LAB — OB RESULTS CONSOLE RUBELLA ANTIBODY, IGM: Rubella: IMMUNE

## 2015-02-18 LAB — OB RESULTS CONSOLE HEPATITIS B SURFACE ANTIGEN: Hepatitis B Surface Ag: NEGATIVE

## 2015-05-10 ENCOUNTER — Inpatient Hospital Stay (HOSPITAL_COMMUNITY)
Admission: AD | Admit: 2015-05-10 | Discharge: 2015-05-12 | DRG: 782 | Disposition: A | Discharge status: Home / Self Care | Payer: Medicaid Other | Source: Ambulatory Visit | Attending: Obstetrics and Gynecology | Admitting: Obstetrics and Gynecology

## 2015-05-10 ENCOUNTER — Encounter (HOSPITAL_COMMUNITY): Payer: Self-pay | Admitting: Anesthesiology

## 2015-05-10 ENCOUNTER — Other Ambulatory Visit: Payer: Self-pay | Admitting: Obstetrics & Gynecology

## 2015-05-10 ENCOUNTER — Encounter (HOSPITAL_COMMUNITY)
Admission: AD | Disposition: A | Discharge status: Home / Self Care | Payer: Self-pay | Source: Ambulatory Visit | Attending: Obstetrics and Gynecology

## 2015-05-10 ENCOUNTER — Inpatient Hospital Stay (HOSPITAL_COMMUNITY): Payer: Medicaid Other | Admitting: Anesthesiology

## 2015-05-10 ENCOUNTER — Ambulatory Visit (HOSPITAL_COMMUNITY)
Admission: AD | Admit: 2015-05-10 | Payer: Medicaid Other | Source: Ambulatory Visit | Admitting: Obstetrics & Gynecology

## 2015-05-10 DIAGNOSIS — O2620 Pregnancy care for patient with recurrent pregnancy loss, unspecified trimester: Secondary | ICD-10-CM | POA: Diagnosis present

## 2015-05-10 DIAGNOSIS — O4703 False labor before 37 completed weeks of gestation, third trimester: Secondary | ICD-10-CM | POA: Diagnosis present

## 2015-05-10 DIAGNOSIS — IMO0002 Reserved for concepts with insufficient information to code with codable children: Secondary | ICD-10-CM | POA: Diagnosis present

## 2015-05-10 DIAGNOSIS — O3433 Maternal care for cervical incompetence, third trimester: Secondary | ICD-10-CM | POA: Diagnosis present

## 2015-05-10 DIAGNOSIS — N883 Incompetence of cervix uteri: Secondary | ICD-10-CM | POA: Diagnosis present

## 2015-05-10 DIAGNOSIS — D649 Anemia, unspecified: Secondary | ICD-10-CM

## 2015-05-10 DIAGNOSIS — Z9889 Other specified postprocedural states: Secondary | ICD-10-CM

## 2015-05-10 DIAGNOSIS — O43129 Velamentous insertion of umbilical cord, unspecified trimester: Secondary | ICD-10-CM | POA: Diagnosis present

## 2015-05-10 DIAGNOSIS — Z3A36 36 weeks gestation of pregnancy: Secondary | ICD-10-CM

## 2015-05-10 DIAGNOSIS — Z3490 Encounter for supervision of normal pregnancy, unspecified, unspecified trimester: Secondary | ICD-10-CM

## 2015-05-10 HISTORY — PX: CERVICAL CERCLAGE: SHX1329

## 2015-05-10 LAB — CBC
HCT: 30 % — ABNORMAL LOW (ref 36.0–46.0)
HEMATOCRIT: 27.1 % — AB (ref 36.0–46.0)
Hemoglobin: 9.4 g/dL — ABNORMAL LOW (ref 12.0–15.0)
Hemoglobin: 8.5 g/dL — ABNORMAL LOW (ref 12.0–15.0)
MCH: 22.5 pg — ABNORMAL LOW (ref 26.0–34.0)
MCH: 22.4 pg — ABNORMAL LOW (ref 26.0–34.0)
MCHC: 31.3 g/dL (ref 30.0–36.0)
MCHC: 31.4 g/dL (ref 30.0–36.0)
MCV: 71.8 fL — AB (ref 78.0–100.0)
MCV: 71.3 fL — AB (ref 78.0–100.0)
PLATELETS: 178 10*3/uL (ref 150–400)
PLATELETS: 154 10*3/uL (ref 150–400)
RBC: 4.18 MIL/uL (ref 3.87–5.11)
RBC: 3.8 MIL/uL — ABNORMAL LOW (ref 3.87–5.11)
RDW: 16.9 % — ABNORMAL HIGH (ref 11.5–15.5)
RDW: 17.1 % — AB (ref 11.5–15.5)
WBC: 7.2 10*3/uL (ref 4.0–10.5)
WBC: 8.2 10*3/uL (ref 4.0–10.5)

## 2015-05-10 LAB — GROUP B STREP BY PCR: Group B strep by PCR: NEGATIVE

## 2015-05-10 LAB — TYPE AND SCREEN
ABO/RH(D): A POS
Antibody Screen: NEGATIVE

## 2015-05-10 LAB — OB RESULTS CONSOLE GBS: GBS: NEGATIVE

## 2015-05-10 SURGERY — CERCLAGE, CERVIX, VAGINAL APPROACH
Anesthesia: Monitor Anesthesia Care | Site: Vagina

## 2015-05-10 MED ORDER — PROPOFOL 500 MG/50ML IV EMUL
INTRAVENOUS | Status: DC | PRN
  Administered 2015-05-10: 25 ug/kg/min via INTRAVENOUS

## 2015-05-10 MED ORDER — ACETAMINOPHEN 325 MG PO TABS
650.0000 mg | ORAL_TABLET | ORAL | Status: DC | PRN
  Administered 2015-05-10: 650 mg via ORAL
  Filled 2015-05-10: qty 2

## 2015-05-10 MED ORDER — MEPERIDINE HCL 25 MG/ML IJ SOLN
6.2500 mg | INTRAMUSCULAR | Status: DC | PRN
  Administered 2015-05-10: 6.25 mg via INTRAVENOUS

## 2015-05-10 MED ORDER — FAMOTIDINE IN NACL 20-0.9 MG/50ML-% IV SOLN
20.0000 mg | Freq: Once | INTRAVENOUS | Status: AC
  Administered 2015-05-10: 20 mg via INTRAVENOUS
  Filled 2015-05-10: qty 50

## 2015-05-10 MED ORDER — CITRIC ACID-SODIUM CITRATE 334-500 MG/5ML PO SOLN
30.0000 mL | Freq: Once | ORAL | Status: AC
  Administered 2015-05-10: 30 mL via ORAL
  Filled 2015-05-10: qty 15

## 2015-05-10 MED ORDER — FENTANYL CITRATE (PF) 100 MCG/2ML IJ SOLN
INTRAMUSCULAR | Status: DC | PRN
  Administered 2015-05-10 (×3): 50 ug via INTRAVENOUS

## 2015-05-10 MED ORDER — OXYCODONE-ACETAMINOPHEN 5-325 MG PO TABS: 1.0000 | ORAL_TABLET | ORAL | Status: DC | PRN

## 2015-05-10 MED ORDER — BUPIVACAINE IN DEXTROSE 0.75-8.25 % IT SOLN
INTRATHECAL | Status: DC | PRN
  Administered 2015-05-10: 1.2 mL via INTRATHECAL

## 2015-05-10 MED ORDER — PROPOFOL 500 MG/50ML IV EMUL
INTRAVENOUS | Status: DC | PRN
  Administered 2015-05-10: 20 mg via INTRAVENOUS
  Administered 2015-05-10: 30 mg via INTRAVENOUS
  Administered 2015-05-10: 20 mg via INTRAVENOUS
  Administered 2015-05-10: 10 mg via INTRAVENOUS
  Administered 2015-05-10: 20 mg via INTRAVENOUS

## 2015-05-10 MED ORDER — ONDANSETRON HCL 4 MG/2ML IJ SOLN: 4.0000 mg | Freq: Four times a day (QID) | INTRAMUSCULAR | Status: DC | PRN

## 2015-05-10 MED ORDER — OXYTOCIN BOLUS FROM INFUSION: 500.0000 mL | INTRAVENOUS | Status: DC

## 2015-05-10 MED ORDER — LIDOCAINE HCL (PF) 1 % IJ SOLN: 30.0000 mL | INTRAMUSCULAR | Status: DC | PRN

## 2015-05-10 MED ORDER — LACTATED RINGERS IV SOLN: 500.0000 mL | INTRAVENOUS | Status: DC | PRN

## 2015-05-10 MED ORDER — ONDANSETRON HCL 4 MG/2ML IJ SOLN
INTRAMUSCULAR | Status: DC | PRN
  Administered 2015-05-10: 4 mg via INTRAVENOUS

## 2015-05-10 MED ORDER — FENTANYL CITRATE (PF) 100 MCG/2ML IJ SOLN: 25.0000 ug | INTRAMUSCULAR | Status: DC | PRN

## 2015-05-10 MED ORDER — LIDOCAINE HCL (CARDIAC) 20 MG/ML IV SOLN
INTRAVENOUS | Status: DC | PRN
  Administered 2015-05-10: 20 mg via INTRAVENOUS
  Administered 2015-05-10: 60 mg via INTRAVENOUS
  Administered 2015-05-10: 20 mg via INTRAVENOUS

## 2015-05-10 MED ORDER — HYDROCODONE-ACETAMINOPHEN 7.5-325 MG PO TABS: 1.0000 | ORAL_TABLET | Freq: Once | ORAL | Status: DC | PRN

## 2015-05-10 MED ORDER — LACTATED RINGERS IV SOLN
INTRAVENOUS | Status: DC
  Administered 2015-05-10 (×4): via INTRAVENOUS

## 2015-05-10 MED ORDER — LACTATED RINGERS IV SOLN
INTRAVENOUS | Status: DC
  Administered 2015-05-11 (×2): via INTRAVENOUS
  Administered 2015-05-11: 1000 mL via INTRAVENOUS

## 2015-05-10 MED ORDER — OXYCODONE-ACETAMINOPHEN 5-325 MG PO TABS: 2.0000 | ORAL_TABLET | ORAL | Status: DC | PRN

## 2015-05-10 MED ORDER — PHENYLEPHRINE HCL 10 MG/ML IJ SOLN
INTRAMUSCULAR | Status: DC | PRN
  Administered 2015-05-10: 60 ug via INTRAVENOUS
  Administered 2015-05-10: 120 ug via INTRAVENOUS
  Administered 2015-05-10: 100 ug via INTRAVENOUS
  Administered 2015-05-10: 40 ug via INTRAVENOUS
  Administered 2015-05-10: 80 ug via INTRAVENOUS

## 2015-05-10 MED ORDER — OXYTOCIN 40 UNITS IN LACTATED RINGERS INFUSION - SIMPLE MED: 62.5000 mL/h | INTRAVENOUS | Status: DC

## 2015-05-10 MED ORDER — CITRIC ACID-SODIUM CITRATE 334-500 MG/5ML PO SOLN: 30.0000 mL | ORAL | Status: DC | PRN

## 2015-05-10 MED ORDER — MEPERIDINE HCL 25 MG/ML IJ SOLN
INTRAMUSCULAR | Status: AC
  Filled 2015-05-10: qty 1

## 2015-05-10 MED ORDER — PROMETHAZINE HCL 25 MG/ML IJ SOLN
6.2500 mg | INTRAMUSCULAR | Status: DC | PRN
Start: 2015-05-10 — End: 2015-05-10

## 2015-05-10 MED ORDER — SILVER NITRATE-POT NITRATE 75-25 % EX MISC
CUTANEOUS | Status: DC | PRN
  Administered 2015-05-10: 2

## 2015-05-10 SURGICAL SUPPLY — 28 items
CLOTH BEACON ORANGE TIMEOUT ST (SAFETY) ×3 IMPLANT
COUNTER NEEDLE 1200 MAGNETIC (NEEDLE) IMPLANT
DRAPE SHEET LG 3/4 BI-LAMINATE (DRAPES) ×2 IMPLANT
DRAPE UNDERBUTTOCKS STRL (DRAPE) ×2 IMPLANT
GLOVE BIO SURGEON STRL SZ 6.5 (GLOVE) ×2 IMPLANT
GLOVE BIO SURGEONS STRL SZ 6.5 (GLOVE) ×1
GLOVE BIOGEL PI IND STRL 6.5 (GLOVE) ×1 IMPLANT
GLOVE BIOGEL PI IND STRL 7.0 (GLOVE) ×1 IMPLANT
GLOVE BIOGEL PI INDICATOR 6.5 (GLOVE) ×2
GLOVE BIOGEL PI INDICATOR 7.0 (GLOVE) ×6
GOWN STRL REUS W/TWL LRG LVL3 (GOWN DISPOSABLE) ×6 IMPLANT
LEGGING LITHOTOMY PAIR STRL (DRAPES) ×2 IMPLANT
NDL MAYO CATGUT SZ4 TPR NDL (NEEDLE) IMPLANT
NEEDLE MAYO CATGUT SZ4 (NEEDLE) IMPLANT
NEEDLE SPNL 22GX3.5 QUINCKE BK (NEEDLE) IMPLANT
NS IRRIG 1000ML POUR BTL (IV SOLUTION) ×3 IMPLANT
PACK VAGINAL MINOR WOMEN LF (CUSTOM PROCEDURE TRAY) ×3 IMPLANT
PAD OB MATERNITY 4.3X12.25 (PERSONAL CARE ITEMS) ×3 IMPLANT
PAD PREP 24X48 CUFFED NSTRL (MISCELLANEOUS) ×3 IMPLANT
SUT MERSILENE 5MM BP 1 12 (SUTURE) IMPLANT
SUT PROLENE 1 CT 1 30 (SUTURE) ×3 IMPLANT
SYR CONTROL 10ML LL (SYRINGE) IMPLANT
TOWEL OR 17X24 6PK STRL BLUE (TOWEL DISPOSABLE) ×6 IMPLANT
TRAY FOLEY CATH SILVER 14FR (SET/KITS/TRAYS/PACK) ×3 IMPLANT
TUBING NON-CON 1/4 X 20 CONN (TUBING) ×1 IMPLANT
TUBING NON-CON 1/4 X 20' CONN (TUBING) ×1
WATER STERILE IRR 1000ML POUR (IV SOLUTION) ×1 IMPLANT
YANKAUER SUCT BULB TIP NO VENT (SUCTIONS) ×2 IMPLANT

## 2015-05-10 NOTE — Progress Notes (Signed)
Labor Progress  Subjective: While in PACU SP cerclage removal pt c/o ctx.  VE by PACU nurse 5cm.  Pt transferred to L&D for evaluation.    Objective: BP 148/82 mmHg  Pulse 84  Temp(Src) 98.2 F (36.8 C) (Oral)  Resp 20  Ht 5\' 2"  (1.575 m)  Wt 166 lb (75.297 kg)  BMI 30.35 kg/m2  SpO2 99%  LMP  (LMP Unknown) I/O last 3 completed shifts: In: 2000 [I.V.:2000] Out: 2 [Blood:2] Total I/O In: 400 [I.V.:400] Out: 400 [Urine:400] FHT: 135 CTX:  irregular, every 3-8 minutes Uterus gravid, soft non tender SVE:  Dilation: 5.5 Effacement (%): 80 Station: -2 Exam by:: V Labradford Schnitker CNM   Assessment:  IUP at 36.2 weeks NICHD: Category 1 Membranes: intact Labor progress: suspected preterm labor GBS: pending   Plan: Continue observation Continuous monitoring Frequent position changes to facilitate fetal rotation and descent. Will reassess with cervical exam at 1130 or earlier if necessary Dr Cletis Media updated     Sallee Provencal, CNM, MSN 05/10/2015. 11:27 PM

## 2015-05-10 NOTE — Anesthesia Preprocedure Evaluation (Signed)
Anesthesia Evaluation  Patient identified by MRN, date of birth, ID band Patient awake    Reviewed: Allergy & Precautions, NPO status , Patient's Chart, lab work & pertinent test results  Airway Mallampati: II  TM Distance: >3 FB Neck ROM: Full    Dental  (+) Teeth Intact   Pulmonary asthma ,    Pulmonary exam normal breath sounds clear to auscultation       Cardiovascular negative cardio ROS Normal cardiovascular exam Rhythm:Regular Rate:Normal     Neuro/Psych negative neurological ROS  negative psych ROS   GI/Hepatic negative GI ROS, Neg liver ROS,   Endo/Other  negative endocrine ROS  Renal/GU negative Renal ROS  negative genitourinary   Musculoskeletal negative musculoskeletal ROS (+)   Abdominal   Peds  Hematology negative hematology ROS (+)   Anesthesia Other Findings   Reproductive/Obstetrics IUP 36 weeks Incompetent Cervix S/P Cerclage                             Anesthesia Physical Anesthesia Plan  ASA: II  Anesthesia Plan: MAC   Post-op Pain Management:    Induction: Intravenous  Airway Management Planned: Natural Airway  Additional Equipment:   Intra-op Plan:   Post-operative Plan:   Informed Consent: I have reviewed the patients History and Physical, chart, labs and discussed the procedure including the risks, benefits and alternatives for the proposed anesthesia with the patient or authorized representative who has indicated his/her understanding and acceptance.   Dental advisory given  Plan Discussed with: CRNA, Surgeon and Anesthesiologist  Anesthesia Plan Comments:         Anesthesia Quick Evaluation

## 2015-05-10 NOTE — Op Note (Signed)
  PATIENT:  Mills Koller  29 y.o. female  PRE-OPERATIVE DIAGNOSIS:  Cerclage in Place, partially removed  POST-OPERATIVE DIAGNOSIS:  Cerclage in Place, partially removed  PROCEDURE:  Procedure(s): REMOVAL OF CERCLAGE CERVICAL (N/A)  SURGEON:  Surgeon(s) and Role:    * Waymon Amato, MD - Primary    * Guss Bunde, MD - Assisting  PROCEDURES:  Cerclage removal  ANESTHESIA: IV sedation, then Spinal anesthesia  PROCEDURES:    Patient was taken to the operating room where IV sedation was administered.  She was prepped and draped in the usual sterile fashion.  Cervical exam revealed a closed cervix.  Speculum was placed in and cervix viewed.  A small piece of the cerclage was removed posteriorly.  Patient was uncomfortable, moving and closing her legs during procedure preventing adequate exposure so spinal anesthesia was then administered.  At this point Dr. Gala Romney was called in for assistance.  An exam at this point revealed cervix to be about  4 cm dilated/60%/-3 with bulging membranes.  There was a small palpable cerclage piece on lateral sides.  It was felt that little cerclage piece left was not preventing cervical dilation, it shall be followed up on in the postpartum period.    Patient was cleaned and taken to recovery room in stable condition.  Plan is for extended monitoring to determine if patient is in labor.    DISPOSITION: To PACU in stable condition.  Dr. Alesia Richards.

## 2015-05-10 NOTE — Brief Op Note (Signed)
05/10/2015  7:10 PM  PATIENT:  Shelly Bradley  29 y.o. female  PRE-OPERATIVE DIAGNOSIS:  Cerclage in Place, partially removed  POST-OPERATIVE DIAGNOSIS:  Cerclage in Place, partially removed  PROCEDURE:  Procedure(s): REMOVAL OF CERCLAGE CERVICAL (N/A)  SURGEON:  Surgeon(s) and Role:    * Waymon Amato, MD - Primary    * Guss Bunde, MD - Assisting  ANESTHESIA:   spinal and IV sedation  EBL:   10cc  BLOOD ADMINISTERED:none  DRAINS: none   LOCAL MEDICATIONS USED:  NONE  SPECIMEN:  No Specimen  DISPOSITION OF SPECIMEN:  N/A  COUNTS:  YES  TOURNIQUET:  * No tourniquets in log *  DICTATION: .Dragon Dictation  PLAN OF CARE: Discharge to home after PACU  PATIENT DISPOSITION:  PACU - hemodynamically stable.   Delay start of Pharmacological VTE agent (>24hrs) due to surgical blood loss or risk of bleeding: not applicable

## 2015-05-10 NOTE — H&P (Signed)
Shelly Bradley is a 29 y.o. female, [phone] at 30 2/7 weeks, presenting for completion of cerclage removal under anesthesia.  Seen in office today by Dr. Alesia Richards, with cerclage removal attempted--able to remove knot, but unable to pull suture through, with patient uncomfortable with procedure.  Plan made to finish removal under anesthesia today.  Patient Active Problem List   Diagnosis Date Noted  . Marginal insertion of umbilical cord 0000000  . Incompetent cervix--cerclage 05/10/2015  . Pregnancy complicated by previous recurrent miscarriages 05/10/2015  . H/O LEEP 05/10/2015  . 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 Anemia--Hgb 9.3 at 28 weeks. 05/10/2015  . Premature delivery before 37 weeks 04/05/2014  . Fibroids 05/05/2012    History of present pregnancy: Patient entered care at 20 3/7 weeks.  Had 19 week [place] at [place], with cervical length of 1.8 cm.  Started on vaginal progesterone.   Repeat US at 22 2/7 weeks showed cervical length 2.66, growth 24%ile, with marginal insertion of cord, with incomplete anatomy.  EFW 24%ile Panorama WNL.   [place] at 26 weeks showed small amount funneling, cervix 2.7. [place] at 33 weeks--EFW 4+8, 51%ile, transverse, anterior placenta, velamentous and marginal insertion of cord, cervix 2-2.3 with funneling. Cerclage remained stable on [place] at 34 weeks, with cervical length 2.26, transverse.  Seen in office today--attempted cerclage removal by Dr. Alesia Richards, able to remove knot, but unable to pull suture through due to patient discomfort.  Sent to MAU for preparation for removal in the OR under anesthesia.  Vtx on [place].     OB History    Gravida Para Term Preterm AB TAB SAB Ectopic Multiple Living   4 1 0 1 2 0 2 0 0 1     2006--SAB in early pregnancy 2012--SAB in early pregnancy 2015--SVB at 89 3/7 weeks, 4+2, female, epidural, with [facility]  Past Medical History  Diagnosis Date  . Asthma   . Abnormal pap 2004  . Fibroid    Past Surgical History   Procedure Laterality Date  . Cervical biopsy  w/ loop electrode excision  2004  . Cervical cerclage N/A 02/10/2015    Procedure: CERCLAGE CERVICAL;  Surgeon: Waymon Amato, MD;  Location: [facility];  Service: Gynecology;  Laterality: N/A;   Family History: family history includes Diabetes in her maternal aunt and maternal grandmother.   Social History:  reports that she has never smoked. She has never used smokeless tobacco. She reports that she drinks alcohol. She reports that she does not use illicit drugs.  Patient is Sales promotion account executive, of the [employer], graduate educated, employed as Transport planner.    Scientist, clinical (histocompatibility and immunogenetics)  Maternal Diabetes: No Genetic Screening: Normal panorama Maternal Ultrasounds/Referrals: Normal, except for short cervix Fetal Ultrasounds or other Referrals:  None Maternal Substance Abuse:  No Significant Maternal Medications:  Meds include: Other: Vaginal progesterone Significant Maternal Lab Results: Lab values include: Other: GBS pending from 05/10/15.  TDAP Declined Flu Declined  ROS:  Occasional cramping, +FM  No Known Allergies     Blood pressure 137/77, pulse 74, temperature 98.2 F (36.8 C), resp. rate 18, not currently breastfeeding.  Chest clear Heart RRR without murmur Abd gravid, NT, FH 36 weeks Pelvic: Deferred Ext: WNL  FHR: 150 by doppler UCs:  None per patient  Prenatal labs: ABO, Rh:  A+ Antibody:  Neg Rubella:  Immune RPR: Non Reactive (06/17 1320)  HBsAg:   Neg HIV: Non Reactive (08/20 1853)  GBS:  Pending from today Sickle cell/Hgb electrophoresis:  AA GC:  Negative 01/15/15 Chlamydia:  Negative 01/15/15 Genetic screenings:  Normal Panorama Glucola:  WNL Other:   Hgb 10.3 at NOB, 9.3 at 28 weeks    Assessment/Plan: IUP at 36 2/7 weeks Incomplete cerclage removal--due to patient discomfort  Plan: Admit to [facility] for cerclage removal under anesthesia per Dr. Alesia Richards. Routine pre-op orders  Allena Katz,  [place] 05/10/2015, 5:35 PM

## 2015-05-10 NOTE — MAU Note (Signed)
Pt presents to MAU for cerclage removal. Dr Alesia Richards tried to remove the cerclage in the office and was unable to, pt was too uncomfortable

## 2015-05-10 NOTE — Anesthesia Procedure Notes (Signed)
Spinal Patient location during procedure: OR Start time: 05/10/2015 6:36 PM Staffing Anesthesiologist: Josephine Igo Performed by: anesthesiologist  Preanesthetic Checklist Completed: patient identified, site marked, surgical consent, pre-op evaluation, timeout performed, IV checked, risks and benefits discussed and monitors and equipment checked Spinal Block Patient position: right lateral decubitus Prep: DuraPrep Patient monitoring: heart rate, cardiac monitor, continuous pulse ox and blood pressure Approach: midline Location: L4-5 Injection technique: single-shot Needle Needle type: Sprotte  Needle gauge: 24 G Needle length: 9 cm Needle insertion depth: 5 cm Assessment Sensory level: T6 Additional Notes Patient tolerated procedure well. Adequate sensory level.

## 2015-05-10 NOTE — Transfer of Care (Signed)
Immediate Anesthesia Transfer of Care Note  Patient: Shelly Bradley  Procedure(s) Performed: Procedure(s): REMOVAL OF CERCLAGE CERVICAL (N/A)  Patient Location: PACU  Anesthesia Type:Spinal  Level of Consciousness: awake, alert , oriented and patient cooperative  Airway & Oxygen Therapy: Patient Spontanous Breathing  Post-op Assessment: Report given to RN and Post -op Vital signs reviewed and stable  Post vital signs: Reviewed and stable  Last Vitals:  TEMP 97.8 BP 103/66 HR 86 RR 20 POX 99  Complications: No apparent anesthesia complications

## 2015-05-11 ENCOUNTER — Encounter (HOSPITAL_COMMUNITY): Payer: Self-pay | Admitting: Obstetrics & Gynecology

## 2015-05-11 LAB — RPR: RPR: NONREACTIVE

## 2015-05-11 MED ORDER — FERROUS SULFATE 325 (65 FE) MG PO TABS
325.0000 mg | ORAL_TABLET | Freq: Two times a day (BID) | ORAL | Status: DC
  Filled 2015-05-11: qty 1

## 2015-05-11 NOTE — Addendum Note (Signed)
Addendum  created 05/11/15 1024 by Asher Muir, CRNA   Modules edited: Clinical Notes   Clinical Notes:  File: [id number]

## 2015-05-11 NOTE — Anesthesia Postprocedure Evaluation (Signed)
Anesthesia Post Note  Patient: TEWANNA SCHREY  Procedure(s) Performed: Procedure(s) (LRB): REMOVAL OF CERCLAGE CERVICAL (N/A)  Patient location during evaluation: PACU Anesthesia Type: Spinal Level of consciousness: awake and alert and oriented Pain management: pain level controlled Vital Signs Assessment: post-procedure vital signs reviewed and stable Respiratory status: spontaneous breathing, nonlabored ventilation and respiratory function stable Cardiovascular status: blood pressure returned to baseline and stable Postop Assessment: no headache, no backache, spinal receding, patient able to bend at knees and no signs of nausea or vomiting Anesthetic complications: no                 Rosellen Lichtenberger A.

## 2015-05-11 NOTE — Progress Notes (Signed)
Subjective: Pt resting in bed. Appears in good spirits. States, if is going into labor she "would rather have the baby this week while her family is here this week".   Objective: BP 127/54 mmHg  Pulse 93  Temp(Src) 98.1 F (36.7 C) (Oral)  Resp 18  Ht 5\' 2"  (1.575 m)  Wt 75.297 kg (166 lb)  BMI 30.35 kg/m2  SpO2 99%  LMP  (LMP Unknown) I/O last 3 completed shifts: In: 2400 [I.V.:2400] Out: 802 [Urine:800; Blood:2]    FHT: Category 1, 140 bpm, +accels, no decels  UC:   Irritability With irregular contractions 8-19min SVE:   Dilation: 4 Effacement (%): 70 Station: -3 Exam by:: R Chelsi Warr CNM Membranes: intact  Assessment:  IUP at 36.3 weeks NICHD: Category 1  Membranes: intact Labor progress: suspected preterm labor GBS negative  Plan: Continue observation Continuous monitoring. Will reassess with cervical exam, as needed If contractions cease - not present on monitor & patient does not feel them may consider discharge with PTL precautions Dr Mancel Bale consulted  [place], [place] 05/11/2015, 12:24 PM

## 2015-05-11 NOTE — Progress Notes (Signed)
Subjective: Resting in bed, in good spirits.  Coping with contractions, reports contractions have been increasing in intensity. Family at bedside.   Objective: BP 118/57 mmHg  Pulse 88  Temp(Src) 98.7 F (37.1 C) (Oral)  Resp 18  Ht 5\' 2"  (1.575 m)  Wt 75.297 kg (166 lb)  BMI 30.35 kg/m2  SpO2 99%  LMP  (LMP Unknown) I/O last 3 completed shifts: In: 2400 [I.V.:2400] Out: 802 [Urine:800; Blood:2]    FHT: Category 1 , 135 bpm, moderate variability, +accels, no decels  UC:   regular, every 6-10 minutes, palpate mild SVE:   Dilation: 5 Effacement (%): 70 Station: -2 Exam by:: R Dorian Duval CNM Membranes: Intact   Assessment:   IUP at 36.3 weeks NICHD: Category 1, 135 bpm, moderate variability, +accels, no decels Membranes: intact Labor progress: suspected preterm labor GBS negative  Plan: Continue observation Continuous monitoring Expectant management Dr Mancel Bale updated  [place], [place] 05/11/2015, 6:09 PM

## 2015-05-11 NOTE — Progress Notes (Signed)
Shelly Bradley MRN: [id number]  Subjective: -Care assumed of 29y.o. [id number] at 36.3wks who presents for early labor s/p cerclage removal under spinal anesthesia.  In room to meet acquaintance.  Patient reports perception of contractions Q10-24min, but denies need for pain mgmt.    Objective: BP 118/57 mmHg  Pulse 88  Temp(Src) 98.7 F (37.1 C) (Oral)  Resp 18  Ht 5\' 2"  (1.575 m)  Wt 75.297 kg (166 lb)  BMI 30.35 kg/m2  SpO2 99%  LMP  (LMP Unknown) I/O last 3 completed shifts: In: 2400 [I.V.:2400] Out: 802 [Urine:800; Blood:2]    Fetal Monitoring: FHT: 135 bpm, Mod Var, -Decels, +Accels UC: Occasionally graphed    Vaginal Exam: SVE:   Dilation: 5 Effacement (%): 70 Station: -2 Exam by:: R Stall CNM at 1736 Membranes:Intact Internal Monitors: None  Augmentation/Induction: Pitocin:None Cytotec: None  Assessment:  IUP at 36.3wks Cat I FT  S/P Cerclage Removal Contractions   Plan: -Discussed POC to include continued observation.  Patient instructed to contact nurse if need for pain medication, rectal pressure, or SROM.  -Patient informed that she may be moved to antepartum unit due to room availability -Patient informed that if no change in VE throughout the night, possible discharge in am--patient relieved at this notion. -Encouraged to rest -No questions or concerns -Dr. Gillermo Murdoch updated on patient status -Continue other mgmt as ordered   Riley Churches, CNM 05/11/2015, 7:48 PM

## 2015-05-11 NOTE — Progress Notes (Signed)
Subjective: Pt awake, doing well. States she "is sore" from yesterdays cerclage removal.  Has many questions about the current plan of care.    Objective: BP 100/46 mmHg  Pulse 85  Temp(Src) 98.3 F (36.8 C) (Oral)  Resp 16  Ht 5\' 2"  (1.575 m)  Wt 75.297 kg (166 lb)  BMI 30.35 kg/m2  SpO2 99%  LMP  (LMP Unknown) I/O last 3 completed shifts: In: 2400 [I.V.:2400] Out: 802 [Urine:800; Blood:2]    FHT: Category  1, 145 bpm, moderate variability, +accels, no decels UC:   regular, every 8-12 minutes SVE:   Dilation: 5.5 Effacement (%): 80 Station: -3 Exam by:: V Standard CNM   Assessment:  IUP at 36.3 weeks NICHD: Category 1 Membranes: intact Labor progress: suspected preterm labor GBS negative  Plan: Update patient on plan of care Continue observation Continuous monitoring Frequent position changes to facilitate fetal rotation and descent. Will reassess with cervical exam, as needed Dr Mancel Bale updated   [place], [place] 05/11/2015, 7:58 AM

## 2015-05-11 NOTE — Anesthesia Postprocedure Evaluation (Signed)
Anesthesia Post Note  Patient: Shelly Bradley  Procedure(s) Performed: Procedure(s) (LRB): REMOVAL OF CERCLAGE CERVICAL (N/A)  Patient location during evaluation: Mother Baby Anesthesia Type: Spinal Level of consciousness: awake Pain management: satisfactory to patient Vital Signs Assessment: post-procedure vital signs reviewed and stable Respiratory status: spontaneous breathing Cardiovascular status: stable Anesthetic complications: no    Last Vitals:  Filed Vitals:   05/11/15 0439 05/11/15 0742  BP: 126/67 100/46  Pulse: 89 85  Temp:  36.8 C  Resp: 18 16    Last Pain:  Filed Vitals:   05/11/15 0743  PainSc: 0-No pain                 Elianie Hubers

## 2015-05-11 NOTE — Progress Notes (Addendum)
Addendum  VE unchanged.   FHR 130, + accel, no decel cat 1, occasional ctx Pt to be observed Light diet Dr Cletis Media consulted

## 2015-05-12 MED ORDER — BETAMETHASONE SOD PHOS & ACET 6 (3-3) MG/ML IJ SUSP
12.0000 mg | Freq: Once | INTRAMUSCULAR | Status: AC
  Administered 2015-05-12: 12 mg via INTRAMUSCULAR
  Filled 2015-05-12: qty 2

## 2015-05-12 NOTE — Progress Notes (Signed)
Pt called out and stated she feels comfortable going home after walking for less than an hour.  Does not desire another SVE.  Lavetta Nielsen, cnm called and notified of this.  CNM will place order for BMZ and discharge home. Pt aware of plan of care

## 2015-05-12 NOTE — Progress Notes (Signed)
Subjective: Pt resting comfortably in bed.  States she was able to sleep through the night.  Reports feeling stronger contractions, every 10 min, that "get her attention" since she woke up this morning.  Family at bedside.    Objective: BP 125/77 mmHg  Pulse 84  Temp(Src) 98.1 F (36.7 C) (Oral)  Resp 20  Ht 5\' 2"  (1.575 m)  Wt 75.297 kg (166 lb)  BMI 30.35 kg/m2  SpO2 99%  LMP  (LMP Unknown) I/O last 3 completed shifts: In: 400 [I.V.:400] Out: 800 [Urine:800]    FHT: Category 1, 140 bpm, +accels, no decels UC:   irregular, every 8-14 minutes, palpate medium/strong SVE:   Dilation: 5 Effacement (%): 70 Station: -2 Exam by:: Leota Maka, cnm  Membranes intact  Assessment:  IUP at 36.4wks  Cat I FT  S/P Cerclage Removal Cervix remains unchanged from last night at 1740 Contractions increasing in intensity, frequency q 8-14 min GBS Negative  Plan: - Continue to evaluate potential for discharge -Patient reports desire for discharge but OK recommended to stay in hospital -Labor precautions given including when to call, when to report to hospital -Questions and concerns addressed-Encouraged to "take it easy," but bed rest not necessary -Discussed desire for infant to be born after 37weeks -Consider BMZ prior to discharge, if appropriate -Continue other mgmt as ordered -Dr. Charlesetta Garibaldi updated and in to evaluate   [place], [place] 05/12/2015, 9:11 AM

## 2015-05-12 NOTE — Discharge Summary (Signed)
Obstetric Discharge Summary Reason for Admission: Cerclage Removal and Preterm contractions Prenatal Procedures: Continous Fetal monitornig Intrapartum Procedures: none Postpartum Procedures: N/A Complications-Operative and Postpartum: none HEMOGLOBIN  Date Value Ref Range Status  05/10/2015 8.5* 12.0 - 15.0 g/dL Final   HCT  Date Value Ref Range Status  05/10/2015 27.1* 36.0 - 46.0 % Final   . betamethasone acetate-betamethasone sodium phosphate  12 mg Intramuscular Once  . ferrous sulfate  325 mg Oral BID WC     Physical Exam:  General: alert and cooperative  VE: 5/70/-2 , changed from 1740 on 05/11/15  Cat 1, 140 bpm, +accels, no decels UC:  Irregular q  8-20 min  I Discharge Diagnoses: Incompetent cervix, preterm contractions  Discharge Information: Date: 05/12/2015 Activity: pelvic rest and Limit activity Diet: routine Medications: None Condition: stable Instructions: Preterm Labor precautions Reviewed- Call if you experience rupture of membranes or contractions continue in intensity and frequency  Discharge to: home   Lavetta Nielsen 05/12/2015, 11:41 AM

## 2015-05-12 NOTE — Discharge Instructions (Signed)

## 2015-05-12 NOTE — Progress Notes (Signed)
Shelly Bradley MRN: [id number]  Subjective: -Patient up to bathroom.  Reports good fetal movement and continued "mucous type discharge with red spotting." Patient further reports sleeping throughout the night and is "felling pretty good."   Objective: BP 110/60 mmHg  Pulse 80  Temp(Src) 98.3 F (36.8 C) (Oral)  Resp 18  Ht 5\' 2"  (1.575 m)  Wt 75.297 kg (166 lb)  BMI 30.35 kg/m2  SpO2 99%  LMP  (LMP Unknown) I/O last 3 completed shifts: In: 2400 [I.V.:2400] Out: 802 [Urine:800; Blood:2]    Fetal Monitoring: FHT: 135 bpm, Mod Var, -Decels, +Accels UC: Occasional    Vaginal Exam: SVE:   Deferred Membranes:Intact Internal Monitors: None  Augmentation/Induction: Pitocin:None Cytotec: None  Assessment:  IUP at 36.4wks Cat I FT  S/P Cerclage Removal Contractions-Occasional GBS Negative  Plan: -Discussed consistency in VE and will defer to oncoming CNM who performed last check -Patient reports desire for discharge -Labor precautions given including when to call, when to report to hospital -Questions and concerns addressed-Encouraged to "take it easy," but bed rest not necessary -Discussed desire for infant to be born after 37weeks -Consider BMZ prior to discharge, if appropriate -Dr. Gillermo Murdoch updated on patient status -Continue other mgmt as ordered -Report to be given to oncoming provider, R. Stall, CNM  Shelly Bradley Shelly Kreger,MSN, CNM 05/12/2015, 6:32 AM

## 2015-05-12 NOTE — Progress Notes (Signed)
Shelly Bradley [id number]  Subjective: Strip and Chart Reviewed.  Objective:  Filed Vitals:   05/11/15 0742 05/11/15 1123 05/11/15 1541 05/11/15 2010  BP: 100/46 127/54 118/57 133/67  Pulse: 85 93 88 94  Temp: 98.3 F (36.8 C) 98.1 F (36.7 C) 98.7 F (37.1 C) 98.5 F (36.9 C)  TempSrc:    Oral  Resp: 16 18 18    Height:      Weight:      SpO2:        FHR: 135 bpm, Mod Var, -Decels, +Accels UC: None Graphed  Assessment: IUP at [redacted]w[redacted]d Cat I FT  Plan: -Continue mgmt as ordered -Will reassess cervix at 0600  JMarcy Panning, CNM 05/12/2015 2:02 AM

## 2015-05-16 ENCOUNTER — Encounter (HOSPITAL_COMMUNITY): Payer: Self-pay

## 2015-05-16 ENCOUNTER — Inpatient Hospital Stay (HOSPITAL_COMMUNITY)
Admission: AD | Admit: 2015-05-16 | Discharge: 2015-05-18 | DRG: 775 | Disposition: A | Discharge status: Home / Self Care | Payer: Medicaid Other | Source: Ambulatory Visit | Attending: Obstetrics and Gynecology | Admitting: Obstetrics and Gynecology

## 2015-05-16 ENCOUNTER — Inpatient Hospital Stay (HOSPITAL_COMMUNITY): Payer: Medicaid Other | Admitting: Anesthesiology

## 2015-05-16 DIAGNOSIS — N883 Incompetence of cervix uteri: Secondary | ICD-10-CM | POA: Diagnosis present

## 2015-05-16 DIAGNOSIS — D259 Leiomyoma of uterus, unspecified: Secondary | ICD-10-CM | POA: Diagnosis present

## 2015-05-16 DIAGNOSIS — O9952 Diseases of the respiratory system complicating childbirth: Secondary | ICD-10-CM | POA: Diagnosis present

## 2015-05-16 DIAGNOSIS — O9902 Anemia complicating childbirth: Secondary | ICD-10-CM | POA: Diagnosis present

## 2015-05-16 DIAGNOSIS — Z3A37 37 weeks gestation of pregnancy: Secondary | ICD-10-CM

## 2015-05-16 DIAGNOSIS — J45909 Unspecified asthma, uncomplicated: Secondary | ICD-10-CM | POA: Diagnosis present

## 2015-05-16 DIAGNOSIS — O3413 Maternal care for benign tumor of corpus uteri, third trimester: Secondary | ICD-10-CM | POA: Diagnosis present

## 2015-05-16 DIAGNOSIS — O43123 Velamentous insertion of umbilical cord, third trimester: Secondary | ICD-10-CM | POA: Diagnosis present

## 2015-05-16 DIAGNOSIS — Z8759 Personal history of other complications of pregnancy, childbirth and the puerperium: Secondary | ICD-10-CM

## 2015-05-16 LAB — CBC
HCT: 28.6 % — ABNORMAL LOW (ref 36.0–46.0)
HEMOGLOBIN: 8.9 g/dL — AB (ref 12.0–15.0)
MCH: 22.3 pg — AB (ref 26.0–34.0)
MCHC: 31.1 g/dL (ref 30.0–36.0)
MCV: 71.7 fL — ABNORMAL LOW (ref 78.0–100.0)
PLATELETS: 202 10*3/uL (ref 150–400)
RBC: 3.99 MIL/uL (ref 3.87–5.11)
RDW: 17.6 % — ABNORMAL HIGH (ref 11.5–15.5)
WBC: 8.3 10*3/uL (ref 4.0–10.5)

## 2015-05-16 LAB — COMPREHENSIVE METABOLIC PANEL
ALK PHOS: 513 U/L — AB (ref 38–126)
ALT: 35 U/L (ref 14–54)
ANION GAP: 9 (ref 5–15)
AST: 43 U/L — ABNORMAL HIGH (ref 15–41)
Albumin: 3.1 g/dL — ABNORMAL LOW (ref 3.5–5.0)
BUN: 5 mg/dL — ABNORMAL LOW (ref 6–20)
CALCIUM: 8.9 mg/dL (ref 8.9–10.3)
CO2: 21 mmol/L — ABNORMAL LOW (ref 22–32)
CREATININE: 0.65 mg/dL (ref 0.44–1.00)
Chloride: 107 mmol/L (ref 101–111)
Glucose, Bld: 89 mg/dL (ref 65–99)
Potassium: 3.7 mmol/L (ref 3.5–5.1)
SODIUM: 137 mmol/L (ref 135–145)
Total Bilirubin: 0.1 mg/dL — ABNORMAL LOW (ref 0.3–1.2)
Total Protein: 6.6 g/dL (ref 6.5–8.1)

## 2015-05-16 LAB — TYPE AND SCREEN
ABO/RH(D): A POS
ANTIBODY SCREEN: NEGATIVE

## 2015-05-16 LAB — LACTATE DEHYDROGENASE: LDH: 167 U/L (ref 98–192)

## 2015-05-16 LAB — RPR: RPR: NONREACTIVE

## 2015-05-16 LAB — URIC ACID: URIC ACID, SERUM: 4.3 mg/dL (ref 2.3–6.6)

## 2015-05-16 MED ORDER — DIPHENHYDRAMINE HCL 25 MG PO CAPS: 25.0000 mg | ORAL_CAPSULE | Freq: Four times a day (QID) | ORAL | Status: DC | PRN

## 2015-05-16 MED ORDER — WITCH HAZEL-GLYCERIN EX PADS: 1.0000 "application " | MEDICATED_PAD | CUTANEOUS | Status: DC | PRN

## 2015-05-16 MED ORDER — BENZOCAINE-MENTHOL 20-0.5 % EX AERO
1.0000 "application " | INHALATION_SPRAY | CUTANEOUS | Status: DC | PRN
  Filled 2015-05-16: qty 56

## 2015-05-16 MED ORDER — IBUPROFEN 600 MG PO TABS
600.0000 mg | ORAL_TABLET | Freq: Four times a day (QID) | ORAL | Status: DC
  Administered 2015-05-16 – 2015-05-18 (×8): 600 mg via ORAL
  Filled 2015-05-16 (×9): qty 1

## 2015-05-16 MED ORDER — DIPHENHYDRAMINE HCL 50 MG/ML IJ SOLN: 12.5000 mg | INTRAMUSCULAR | Status: DC | PRN

## 2015-05-16 MED ORDER — ACETAMINOPHEN 325 MG PO TABS: 650.0000 mg | ORAL_TABLET | ORAL | Status: DC | PRN

## 2015-05-16 MED ORDER — TETANUS-DIPHTH-ACELL PERTUSSIS 5-2.5-18.5 LF-MCG/0.5 IM SUSP: 0.5000 mL | Freq: Once | INTRAMUSCULAR | Status: DC

## 2015-05-16 MED ORDER — CITRIC ACID-SODIUM CITRATE 334-500 MG/5ML PO SOLN: 30.0000 mL | ORAL | Status: DC | PRN

## 2015-05-16 MED ORDER — PRENATAL MULTIVITAMIN CH
1.0000 | ORAL_TABLET | Freq: Every day | ORAL | Status: DC
  Administered 2015-05-17: 1 via ORAL
  Filled 2015-05-16: qty 1

## 2015-05-16 MED ORDER — LACTATED RINGERS IV SOLN
INTRAVENOUS | Status: DC
  Administered 2015-05-16: 04:00:00 via INTRAVENOUS

## 2015-05-16 MED ORDER — EPHEDRINE 5 MG/ML INJ: 10.0000 mg | INTRAVENOUS | Status: DC | PRN

## 2015-05-16 MED ORDER — LANOLIN HYDROUS EX OINT: TOPICAL_OINTMENT | CUTANEOUS | Status: DC | PRN

## 2015-05-16 MED ORDER — PHENYLEPHRINE 40 MCG/ML (10ML) SYRINGE FOR IV PUSH (FOR BLOOD PRESSURE SUPPORT)
PREFILLED_SYRINGE | INTRAVENOUS | Status: AC
  Filled 2015-05-16: qty 20

## 2015-05-16 MED ORDER — DIBUCAINE 1 % RE OINT
1.0000 "application " | TOPICAL_OINTMENT | RECTAL | Status: DC | PRN
  Filled 2015-05-16: qty 28

## 2015-05-16 MED ORDER — FLEET ENEMA 7-19 GM/118ML RE ENEM: 1.0000 | ENEMA | RECTAL | Status: DC | PRN

## 2015-05-16 MED ORDER — LACTATED RINGERS IV SOLN
500.0000 mL | INTRAVENOUS | Status: DC | PRN
  Administered 2015-05-16 (×2): 500 mL via INTRAVENOUS

## 2015-05-16 MED ORDER — FENTANYL 2.5 MCG/ML BUPIVACAINE 1/10 % EPIDURAL INFUSION (WH - ANES)
INTRAMUSCULAR | Status: AC
  Administered 2015-05-16: 14 mL/h via EPIDURAL
  Filled 2015-05-16: qty 125

## 2015-05-16 MED ORDER — SIMETHICONE 80 MG PO CHEW: 80.0000 mg | CHEWABLE_TABLET | ORAL | Status: DC | PRN

## 2015-05-16 MED ORDER — OXYCODONE-ACETAMINOPHEN 5-325 MG PO TABS: 2.0000 | ORAL_TABLET | ORAL | Status: DC | PRN

## 2015-05-16 MED ORDER — ZOLPIDEM TARTRATE 5 MG PO TABS: 5.0000 mg | ORAL_TABLET | Freq: Every evening | ORAL | Status: DC | PRN

## 2015-05-16 MED ORDER — OXYTOCIN 40 UNITS IN LACTATED RINGERS INFUSION - SIMPLE MED
62.5000 mL/h | INTRAVENOUS | Status: DC
  Administered 2015-05-16: 62.5 mL/h via INTRAVENOUS
  Filled 2015-05-16: qty 1000

## 2015-05-16 MED ORDER — LIDOCAINE HCL (PF) 1 % IJ SOLN
INTRAMUSCULAR | Status: DC | PRN
  Administered 2015-05-16 (×2): 8 mL via EPIDURAL

## 2015-05-16 MED ORDER — OXYTOCIN BOLUS FROM INFUSION: 500.0000 mL | INTRAVENOUS | Status: DC

## 2015-05-16 MED ORDER — OXYCODONE-ACETAMINOPHEN 5-325 MG PO TABS: 1.0000 | ORAL_TABLET | ORAL | Status: DC | PRN

## 2015-05-16 MED ORDER — PHENYLEPHRINE 40 MCG/ML (10ML) SYRINGE FOR IV PUSH (FOR BLOOD PRESSURE SUPPORT): 80.0000 ug | PREFILLED_SYRINGE | INTRAVENOUS | Status: DC | PRN

## 2015-05-16 MED ORDER — ONDANSETRON HCL 4 MG PO TABS
4.0000 mg | ORAL_TABLET | ORAL | Status: DC | PRN
Start: 2015-05-16 — End: 2015-05-18

## 2015-05-16 MED ORDER — ONDANSETRON HCL 4 MG/2ML IJ SOLN: 4.0000 mg | INTRAMUSCULAR | Status: DC | PRN

## 2015-05-16 MED ORDER — LIDOCAINE HCL (PF) 1 % IJ SOLN
30.0000 mL | INTRAMUSCULAR | Status: DC | PRN
  Filled 2015-05-16: qty 30

## 2015-05-16 MED ORDER — ONDANSETRON HCL 4 MG/2ML IJ SOLN: 4.0000 mg | Freq: Four times a day (QID) | INTRAMUSCULAR | Status: DC | PRN

## 2015-05-16 MED ORDER — SENNOSIDES-DOCUSATE SODIUM 8.6-50 MG PO TABS
2.0000 | ORAL_TABLET | ORAL | Status: DC
  Administered 2015-05-16 – 2015-05-17 (×2): 2 via ORAL
  Filled 2015-05-16 (×2): qty 2

## 2015-05-16 MED ORDER — OXYCODONE-ACETAMINOPHEN 5-325 MG PO TABS
1.0000 | ORAL_TABLET | ORAL | Status: DC | PRN
  Administered 2015-05-16: 1 via ORAL
  Filled 2015-05-16: qty 1

## 2015-05-16 MED ORDER — FENTANYL 2.5 MCG/ML BUPIVACAINE 1/10 % EPIDURAL INFUSION (WH - ANES)
14.0000 mL/h | INTRAMUSCULAR | Status: DC | PRN
  Administered 2015-05-16 (×2): 14 mL/h via EPIDURAL

## 2015-05-16 NOTE — Anesthesia Preprocedure Evaluation (Signed)
Anesthesia Evaluation  Patient identified by MRN, date of birth, ID band Patient awake    Reviewed: Allergy & Precautions, H&P , NPO status , Patient's Chart, lab work & pertinent test results  Airway Mallampati: I  TM Distance: >3 FB Neck ROM: full    Dental no notable dental hx.    Pulmonary    Pulmonary exam normal        Cardiovascular negative cardio ROS Normal cardiovascular exam     Neuro/Psych negative neurological ROS  negative psych ROS   GI/Hepatic negative GI ROS, Neg liver ROS,   Endo/Other  negative endocrine ROS  Renal/GU negative Renal ROS     Musculoskeletal   Abdominal Normal abdominal exam  (+)   Peds  Hematology   Anesthesia Other Findings   Reproductive/Obstetrics (+) Pregnancy                             Anesthesia Physical Anesthesia Plan  ASA: II  Anesthesia Plan: Epidural   Post-op Pain Management:    Induction:   Airway Management Planned:   Additional Equipment:   Intra-op Plan:   Post-operative Plan:   Informed Consent: I have reviewed the patients History and Physical, chart, labs and discussed the procedure including the risks, benefits and alternatives for the proposed anesthesia with the patient or authorized representative who has indicated his/her understanding and acceptance.     Plan Discussed with:   Anesthesia Plan Comments:         Anesthesia Quick Evaluation

## 2015-05-16 NOTE — Anesthesia Procedure Notes (Signed)
Epidural Patient location during procedure: OB Start time: 05/16/2015 5:02 AM End time: 05/16/2015 5:06 AM  Staffing Anesthesiologist: Lyn Hollingshead Performed by: anesthesiologist   Preanesthetic Checklist Completed: patient identified, surgical consent, pre-op evaluation, timeout performed, IV checked, risks and benefits discussed and monitors and equipment checked  Epidural Patient position: sitting Prep: site prepped and draped and DuraPrep Patient monitoring: continuous pulse ox and blood pressure Approach: midline Location: L3-L4 Injection technique: LOR air  Needle:  Needle type: Tuohy  Needle gauge: 17 G Needle length: 9 cm and 9 Needle insertion depth: 5 cm cm Catheter type: closed end flexible Catheter size: 19 Gauge Catheter at skin depth: 10 cm Test dose: negative and Other  Assessment Sensory level: T9 Events: blood not aspirated, injection not painful, no injection resistance, negative IV test and no paresthesia  Additional Notes Reason for block:procedure for pain

## 2015-05-16 NOTE — H&P (Signed)
Shelly Bradley is a 29 y.o. female, [phone] at 37.1 weeks, presenting for  Contractions and rupture of membranes, clear fluid at 0300.   GBS negative, denies bleeding.  Cervix Was 4-5 cm on last exam.   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   Patient Active Problem List   Diagnosis Date Noted  . Indication for care in labor or delivery 05/16/2015  . Marginal insertion of umbilical cord 0000000  . Incompetent cervix--cerclage 05/10/2015  . Pregnancy complicated by previous recurrent miscarriages 05/10/2015  . H/O LEEP 05/10/2015  . 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 Anemia--Hgb 9.3 at 28 weeks. 05/10/2015  . Pregnancy 05/10/2015  . Premature delivery before 37 weeks 04/05/2014  . Fibroids 05/05/2012    History of present pregnancy: Patient entered care at 20 3/7 weeks. Had 19 week [place] at [facility], with cervical length of 1.8 cm. Started on vaginal progesterone.  Repeat US at 22 2/7 weeks showed cervical length 2.66, growth 24%ile, with marginal insertion of cord, with incomplete anatomy. EFW 24%ile Panorama WNL.  K[place] at 26 weeks showed small amount funneling, cervix 2.7. [place] at 33 weeks--EFW 4+8, 51%ile, transverse, anterior placenta, velamentous and marginal insertion of cord, cervix 2-2.3 with funneling.  Cerclage remained stable on Korea at 34 weeks, with cervical length 2.26, transverse.  Last Seen in office 05/10/15--attempted cerclage removal by Dr. Alesia Richards, able to remove knot, but unable to pull suture through due to patient discomfort. S/p  Removal of cerclage in the OR under anesthesia, pt began contracting and was monitored to 1- 2 days for threatened preterm labor.  Once stable, pt was discharged home.  Vtx on [place].  OB History    Gravida Para Term Preterm AB TAB SAB Ectopic Multiple Living   4 1 0 1 2 0 2 0 0 1      2006--SAB in early pregnancy 2012--SAB in early pregnancy 2015--SVB at 32 3/7 weeks, 4+2, female, epidural, with [facility]    Past Medical History  Diagnosis Date  . Asthma   . Abnormal pap 2004  . Fibroid    Past Surgical History  Procedure Laterality Date  . Cervical biopsy  w/ loop electrode excision  2004  . Cervical cerclage N/A 02/10/2015    Procedure: CERCLAGE CERVICAL;  Surgeon: Waymon Amato, MD;  Location: [facility];  Service: Gynecology;  Laterality: N/A;  . Cervical cerclage N/A 05/10/2015    Procedure: REMOVAL OF CERCLAGE CERVICAL;  Surgeon: Waymon Amato, MD;  Location: [facility];  Service: Gynecology;  Laterality: N/A;   Family History: family history includes Diabetes in her maternal aunt and maternal grandmother. Social History:  reports that she has never smoked. She has never used smokeless tobacco. She reports that she drinks alcohol. She reports that she does not use illicit drugs. Patient is Sales promotion account executive, of the [employer], graduate educated, employed as Transport planner.    Scientist, clinical (histocompatibility and immunogenetics)  Maternal Diabetes: No Genetic Screening: Normal panorama Maternal Ultrasounds/Referrals: Normal except for short cervix Fetal Ultrasounds or other Referrals:  None Maternal Substance Abuse:  No Significant Maternal Medications:  Meds include: Progesterone, vaginal Significant Maternal Lab Results: Lab values include: Group B Strep negative  TDAP Declined Flu Declined  ROS: SROM at 0300, Contractions q4-6 min,   +FM   No Known Allergies   Dilation: 5 Effacement (%): 80 Station: -2 Exam by:: [employer] RN   Blood pressure  164/89, pulse 86, resp. rate 18, height 5\' 1"  (1.549 m), weight 75.297 kg (166 lb), not currently breastfeeding.  Chest clear Heart RRR without murmur Abd gravid, NT, FH 36 weeks Pelvic: Deferred Ext: WNL  FHR: Category 125 bpm, moderate variability, +accels, no decels UCs:  Irregular, q 3-5 min, palpate strong  Prenatal labs: ABO, Rh:  --/--/A POS (12/13 2215) Antibody: NEG (12/13 2215) Rubella:  !Error!   Immune RPR: Non Reactive (12/13 2215)  HBsAg: Negative (09/23 0000)  HIV: Non Reactive (08/20 1853)  GBS: Negative (12/13 0000) Sickle cell/Hgb electrophoresis: AA GC: Negative 01/15/15 Chlamydia: Negative 01/15/15 Genetic screenings: Normal Panorama Glucola: WNL Other:  Hgb 10.3 at NOB, 9.3 at 28 weeks   Assessment/Plan: IUP at 37.1  SROM at 0300 Cat 1 FT Marginal/ Velamentous insertion of cord Early Labor Anemia, HGb 8.9 Hx incompetent cervix Fibroids  Plan: Admit to [place] per consult with Dr. Mancel Bale Routine [facility] orders Pain med/epidural prn   Cherre Huger, [place] 05/16/2015, 5:05 AM

## 2015-05-16 NOTE — Progress Notes (Signed)
[facility] [place] Cerise, Osvaldo Angst, RN, Reatha Harps, RN at bedside

## 2015-05-16 NOTE — Lactation Note (Signed)
This note was copied from the chart of [place]. Lactation Consultation Note  Patient Name: Shelly Bradley [id number] Date: 05/16/2015 Reason for consult: Initial assessment;Infant < 6lbs;Late preterm infant   Initial consult with mom of 14 hour old infant born at 43 w 1 d GA weighing 5 lb 11.2 oz. Infant with BF x 3 for 10-15 minutes, 1 void and 0 stool since birth. Mom holding infant sts and infant asleep. Mom says she would like to BR and BO feed infant. Discussed encouraging him to feed 8-12 x in 24 hours at breast. Discussed LPT infant behavior and protocol with mom, dad and gmother. Discussed that infant on border of LPT infant and Early Term infant. Mom reports her 34 month old was born at 32 weeks and spent 2 months in NICU, that child did not latch and was bottle fed EBM x 5 months. This infant recently had decreased temp. Mom wishes to begin pumping and supplementing infant, reviewed supplementation guidelines and amounts per LPT infant protocol. Set up DEBP for mom to pump on Initiate setting for 15 minutes every 2-3 hours post BF. Advised mom to awaken infant every 3 hours if he is not awakening to feed. Mom voiced understanding to all teaching. [facility] Brochure given, informed mom of IP/OP Services, phone # and Support Groups. Enc mom to attend Support Groups. Mom is a [facility] client and wishes to rent a pump, she is aware of [facility] loaner program and that [facility] is currently short on pumps. [facility] [employer] referral form filled out and faxed to [facility] office. Mom pumped 3 cc from left breast, 0 from right. We were able to hand express up to 5 ml of EBM from left breast. Mom with compressible breasts and everted nipples. Right nipple does have a divot in the center and mom reports it is generally inverted, more everted since delivery. Taught mom finger feeding with curved tip syringe, infant ate 5 ml eagerly and did well. Report given to [facility], Therapist, sports. Mom is to BF for 15-20 minutes, supplement  EBM/Alimentum, pump for 15 min on Initiate setting, followed by hand expression. Mom with written instructions in room to follow.   Maternal Data Formula Feeding for Exclusion: No Has patient been taught Hand Expression?: Yes Does the patient have breastfeeding experience prior to this delivery?: Yes (pumped and bottle fed 29 yo for 5 months, 29 yo born at 7 weeks and in NICU 2 months)  Feeding Feeding Type: Breast Fed Length of feed: 15 min  LATCH Score/Interventions                      Lactation Tools Discussed/Used WIC Program: Yes Pump Review: Setup, frequency, and cleaning;Milk Storage Initiated by:: Nonah Mattes, RN IBCLC Date initiated:: 05/17/15   Consult Status Consult Status: Follow-up Date: 05/17/15 Follow-up type: In-patient    Debby Freiberg Jontrell Bushong 05/16/2015, 3:13 PM

## 2015-05-16 NOTE — Anesthesia Postprocedure Evaluation (Signed)
Anesthesia Post Note  Patient: Shelly Bradley  Procedure(s) Performed: * No procedures listed *  Patient location during evaluation: Mother Baby Anesthesia Type: Epidural Level of consciousness: awake, awake and alert, oriented and patient cooperative Pain management: pain level controlled Vital Signs Assessment: post-procedure vital signs reviewed and stable Respiratory status: spontaneous breathing, nonlabored ventilation and respiratory function stable Cardiovascular status: stable Postop Assessment: no headache, no backache, patient able to bend at knees and no signs of nausea or vomiting Anesthetic complications: no    Last Vitals:  Filed Vitals:   05/16/15 0917 05/16/15 1045  BP: 134/69 157/60  Pulse: 72 56  Temp:  36.6 C  Resp:  18    Last Pain:  Filed Vitals:   05/16/15 1250  PainSc: 5                  Kalese Ensz L

## 2015-05-16 NOTE — Progress Notes (Signed)
Dorinda Hill, RN at bedside

## 2015-05-16 NOTE — MAU Note (Signed)
Pt here with rupture of membranes, clear fluid at 0300.  Was 4-5 cm at last exam. GBS negative. Denies any bleeding. Having contractions regularly for "a few hours"

## 2015-05-16 NOTE — Progress Notes (Signed)
Viable baby  Boy delivered at 0731, apgars 9/9. Mancel Bale md

## 2015-05-16 NOTE — Progress Notes (Signed)
Orders received for admission and epidural prn.  

## 2015-05-17 LAB — CBC
HEMATOCRIT: 24.7 % — AB (ref 36.0–46.0)
HEMOGLOBIN: 7.7 g/dL — AB (ref 12.0–15.0)
MCH: 22.2 pg — ABNORMAL LOW (ref 26.0–34.0)
MCHC: 31.2 g/dL (ref 30.0–36.0)
MCV: 71.2 fL — ABNORMAL LOW (ref 78.0–100.0)
Platelets: 170 10*3/uL (ref 150–400)
RBC: 3.47 MIL/uL — AB (ref 3.87–5.11)
RDW: 17.4 % — ABNORMAL HIGH (ref 11.5–15.5)
WBC: 11.6 10*3/uL — ABNORMAL HIGH (ref 4.0–10.5)

## 2015-05-17 MED ORDER — FERROUS SULFATE 325 (65 FE) MG PO TABS
325.0000 mg | ORAL_TABLET | Freq: Two times a day (BID) | ORAL | Status: DC
  Administered 2015-05-17 – 2015-05-18 (×3): 325 mg via ORAL
  Filled 2015-05-17 (×3): qty 1

## 2015-05-17 MED ORDER — OXYCODONE-ACETAMINOPHEN 5-325 MG PO TABS: 1.0000 | ORAL_TABLET | ORAL | Status: DC | PRN

## 2015-05-17 MED ORDER — IBUPROFEN 600 MG PO TABS: 600.0000 mg | ORAL_TABLET | Freq: Four times a day (QID) | ORAL | Status: DC | PRN

## 2015-05-17 MED ORDER — NORETHINDRONE 0.35 MG PO TABS: 1.0000 | ORAL_TABLET | Freq: Every day | ORAL | Status: DC

## 2015-05-17 NOTE — Discharge Instructions (Signed)
Continue to take iron supplement as before delivery.  Postpartum Care After Vaginal Delivery After you deliver your newborn (postpartum period), the usual stay in the hospital is 24-72 hours. If there were problems with your labor or delivery, or if you have other medical problems, you might be in the hospital longer.  While you are in the hospital, you will receive help and instructions on how to care for yourself and your newborn during the postpartum period.  While you are in the hospital:  Be sure to tell your nurses if you have pain or discomfort, as well as where you feel the pain and what makes the pain worse.  If you had an incision made near your vagina (episiotomy) or if you had some tearing during delivery, the nurses may put ice packs on your episiotomy or tear. The ice packs may help to reduce the pain and swelling.  If you are breastfeeding, you may feel uncomfortable contractions of your uterus for a couple of weeks. This is normal. The contractions help your uterus get back to normal size.  It is normal to have some bleeding after delivery.  For the first 1-3 days after delivery, the flow is red and the amount may be similar to a period.  It is common for the flow to start and stop.  In the first few days, you may pass some small clots. Let your nurses know if you begin to pass large clots or your flow increases.  Do not  flush blood clots down the toilet before having the nurse look at them.  During the next 3-10 days after delivery, your flow should become more watery and pink or brown-tinged in color.  Ten to fourteen days after delivery, your flow should be a small amount of yellowish-white discharge.  The amount of your flow will decrease over the first few weeks after delivery. Your flow may stop in 6-8 weeks. Most women have had their flow stop by 12 weeks after delivery.  You should change your sanitary pads frequently.  Wash your hands thoroughly with soap and  water for at least 20 seconds after changing pads, using the toilet, or before holding or feeding your newborn.  You should feel like you need to empty your bladder within the first 6-8 hours after delivery.  In case you become weak, lightheaded, or faint, call your nurse before you get out of bed for the first time and before you take a shower for the first time.  Within the first few days after delivery, your breasts may begin to feel tender and full. This is called engorgement. Breast tenderness usually goes away within 48-72 hours after engorgement occurs. You may also notice milk leaking from your breasts. If you are not breastfeeding, do not stimulate your breasts. Breast stimulation can make your breasts produce more milk.  Spending as much time as possible with your newborn is very important. During this time, you and your newborn can feel close and get to know each other. Having your newborn stay in your room (rooming in) will help to strengthen the bond with your newborn. It will give you time to get to know your newborn and become comfortable caring for your newborn.  Your hormones change after delivery. Sometimes the hormone changes can temporarily cause you to feel sad or tearful. These feelings should not last more than a few days. If these feelings last longer than that, you should talk to your caregiver.  If desired, talk to  your caregiver about methods of family planning or contraception.  Talk to your caregiver about immunizations. Your caregiver may want you to have the following immunizations before leaving the hospital:  Tetanus, diphtheria, and pertussis (Tdap) or tetanus and diphtheria (Td) immunization. It is very important that you and your family (including grandparents) or others caring for your newborn are up-to-date with the Tdap or Td immunizations. The Tdap or Td immunization can help protect your newborn from getting ill.  Rubella immunization.  Varicella  (chickenpox) immunization.  Influenza immunization. You should receive this annual immunization if you did not receive the immunization during your pregnancy.   This information is not intended to replace advice given to you by your health care provider. Make sure you discuss any questions you have with your health care provider.   Document Released: 03/11/2007 Document Revised: 02/06/2012 Document Reviewed: 01/09/2012 Elsevier Interactive Patient Education 2016 [employer].  Iron Deficiency Anemia, Adult Anemia is a condition in which there are less red blood cells or hemoglobin in the blood than normal. Hemoglobin is the part of red blood cells that carries oxygen. Iron deficiency anemia is anemia caused by too little iron. It is the most common type of anemia. It may leave you tired and short of breath. CAUSES   Lack of iron in the diet.  Poor absorption of iron, as seen with intestinal disorders.  Intestinal bleeding.  Heavy periods. SIGNS AND SYMPTOMS  Mild anemia may not be noticeable. Symptoms may include:  Fatigue.  Headache.  Pale skin.  Weakness.  Tiredness.  Shortness of breath.  Dizziness.  Cold hands and feet.  Fast or irregular heartbeat. DIAGNOSIS  Diagnosis requires a thorough evaluation and physical exam by your health care provider. Blood tests are generally used to confirm iron deficiency anemia. Additional tests may be done to find the underlying cause of your anemia. These may include:  Testing for blood in the stool (fecal occult blood test).  A procedure to see inside the colon and rectum (colonoscopy).  A procedure to see inside the esophagus and stomach (endoscopy). TREATMENT  Iron deficiency anemia is treated by correcting the cause of the deficiency. Treatment may involve:  Adding iron-rich foods to your diet.  Taking iron supplements. Pregnant or breastfeeding women need to take extra iron because their normal diet usually does not  provide the required amount.  Taking vitamins. Vitamin C improves the absorption of iron. Your health care provider may recommend that you take your iron tablets with a glass of orange juice or vitamin C supplement.  Medicines to make heavy menstrual flow lighter.  Surgery. HOME CARE INSTRUCTIONS   Take iron as directed by your health care provider.  If you cannot tolerate taking iron supplements by mouth, talk to your health care provider about taking them through a vein (intravenously) or an injection into a muscle.  For the best iron absorption, iron supplements should be taken on an empty stomach. If you cannot tolerate them on an empty stomach, you may need to take them with food.  Do not drink milk or take antacids at the same time as your iron supplements. Milk and antacids may interfere with the absorption of iron.  Iron supplements can cause constipation. Make sure to include fiber in your diet to prevent constipation. A stool softener may also be recommended.  Take vitamins as directed by your health care provider.  Eat a diet rich in iron. Foods high in iron include liver, lean beef, whole-grain bread,  eggs, dried fruit, and dark green leafy vegetables. SEEK IMMEDIATE MEDICAL CARE IF:   You faint. If this happens, do not drive. Call your local emergency services (911 in [place] if no other help is available.  You have chest pain.  You feel nauseous or vomit.  You have severe or increased shortness of breath with activity.  You feel weak.  You have a rapid heartbeat.  You have unexplained sweating.  You become light-headed when getting up from a chair or bed. MAKE SURE YOU:   Understand these instructions.  Will watch your condition.  Will get help right away if you are not doing well or get worse.   This information is not intended to replace advice given to you by your health care provider. Make sure you discuss any questions you have with your health care  provider.   Document Released: 05/11/2000 Document Revised: 06/04/2014 Document Reviewed: 01/19/2013 Elsevier Interactive Patient Education [employer].

## 2015-05-17 NOTE — Progress Notes (Signed)
Subjective: Postpartum Day 1: Vaginal delivery, periurethral laceration Patient up ad lib, reports no syncope or dizziness.  Denies HA, visual sx, or epigastric pain. Feeding:  Breast and bottle Contraceptive plan:  Micronor  Initially desired d/c today--now wants d/c tomorrow.  Objective: Vital signs in last 24 hours: Temp:  [98.1 F (36.7 C)-98.5 F (36.9 C)] 98.1 F (36.7 C) (12/20 0704) Pulse Rate:  [57-85] 63 (12/20 0704) Resp:  [18] 18 (12/20 0704) BP: (124-144)/(64-88) 144/82 mmHg (12/20 0704)   Filed Vitals:   05/16/15 1145 05/16/15 1545 05/16/15 2330 05/17/15 0704  BP: 142/75 142/88 124/64 144/82  Pulse: 57 57 85 63  Temp: 98 F (36.7 C) 98.5 F (36.9 C) 98.3 F (36.8 C) 98.1 F (36.7 C)  TempSrc: Oral Oral Oral Oral  Resp: 18 18 18 18   Height:      Weight:      SpO2:       Orthostatics stable.    Physical Exam:  General: alert Lochia: appropriate Uterine Fundus: firm Perineum: healing well DVT Evaluation: No evidence of DVT seen on physical exam. Negative Homan's sign. DTR 2+, no clonus, no edema   CBC Latest Ref Rng 05/17/2015 05/16/2015 05/10/2015  WBC 4.0 - 10.5 K/uL 11.6(H) 8.3 8.2  Hemoglobin 12.0 - 15.0 g/dL 7.7(L) 8.9(L) 8.5(L)  Hematocrit 36.0 - 46.0 % 24.7(L) 28.6(L) 27.1(L)  Platelets 150 - 400 K/uL 170 202 154     Assessment/Plan: Status post vaginal delivery day 1--VE for 2nd stage bradycardia Chronic anemia--hemodynamically asymptomatic  Continue current care. Continue to monitor BP--does not currently meet criteria to treat Plan for discharge tomorrow  Rx Percocet generated today, given to RN. Rx Motrin and Micronor sent to patient's pharmacy.    Shelly Bradley, [name] 05/17/2015, 1:17 PM

## 2015-05-17 NOTE — Lactation Note (Signed)
This note was copied from the chart of [place]. Lactation Consultation Note  Patient Name: Shelly Bradley [id number] Date: 05/17/2015 Reason for consult: Follow-up assessment;Infant < 6lbs;Pump rental Mom reports baby sleepy today and not wanting to latch. Mom pump/bottle fed her 1st baby who was born at 63 weeks. LC assisted Mom at this visit with positioning and using breast compression to help baby achieve a deeper latch. Mom's left nipple is erect, Mom reports pain with nursing but this did improve with this feeding when baby had more depth, slight crease across nipple when baby came off the breast. Both nutritive and non-nutritive suckling observed.  Mom's right nipple is erect but has shorter shaft than left with deep dimpling center of nipple. Baby however was able to obtain good depth with minimal assist. Demonstrated how to bring bottom lip down for more depth. Mom denied discomfort. Encouraged Mom to continue to BF with feeding ques but limit time at breast to 30 minutes. She may consider pre-pumping before latch. Post pump for 15 minutes after feeding. Continue to supplement with feedings 10-20 ml today. Mom prefers bottle. [place] loaner pump rental completed. [place] appointment scheduled for Thursday but Mom was told WIC low on pumps. Care for sore nipples reviewed. Comfort gels given with instructions. Encouraged to call for assist as needed.   Maternal Data    Feeding Feeding Type: Breast Fed Length of feed: 15 min  LATCH Score/Interventions Latch: Repeated attempts needed to sustain latch, nipple held in mouth throughout feeding, stimulation needed to elicit sucking reflex. Intervention(s): Adjust position;Assist with latch;Breast massage;Breast compression  Audible Swallowing: A few with stimulation  Type of Nipple: Everted at rest and after stimulation  Comfort (Breast/Nipple): Soft / non-tender     Hold (Positioning): Assistance needed to correctly position infant at  breast and maintain latch. Intervention(s): Breastfeeding basics reviewed;Support Pillows;Position options;Skin to skin  LATCH Score: 7  Lactation Tools Discussed/Used Tools: Pump Breast pump type: Double-Electric Breast Pump WIC Program: Yes   Consult Status Consult Status: Follow-up Date: 05/18/15 Follow-up type: In-patient    Katrine Coho 05/17/2015, 2:34 PM

## 2015-05-18 NOTE — Discharge Summary (Signed)
OB Discharge Summary   Patient Name: Shelly Bradley DOB: [birth year] MRN: [id number]  Date of admission: 05/16/2015 Delivering MD: Everett Graff   Date of discharge: 05/17/2015  Admitting diagnosis: 37wks, water broke Intrauterine pregnancy: [redacted]w[redacted]d  Secondary diagnosis: Principal Problem:  Status post vacuum-assisted vaginal delivery  Additional problems:  Patient Active Problem List   Diagnosis Date Noted  . Status post vacuum-assisted vaginal delivery 05/16/2015  . Marginal insertion of umbilical cord 0000000  . Incompetent cervix--cerclage 05/10/2015  . Pregnancy complicated by previous recurrent miscarriages 05/10/2015  . H/O LEEP 05/10/2015  . 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 Anemia--Hgb 9.3 at 28 weeks. 05/10/2015  . Pregnancy 05/10/2015  . Premature delivery before 37 weeks 04/05/2014  . Fibroids 05/05/2012    Discharge diagnosis: Term Pregnancy Delivered and Anemia--chronic     Post partum procedures:None  Augmentation: None  Complications: None  Hospital course: Onset of Labor With Vaginal Delivery 29 y.o. yo [id number] at [redacted]w[redacted]d was admitted in Active Laboron 05/16/2015. Patient had an uncomplicated labor course as follows:  Membrane Rupture Time/Date: 3:00 AM ,05/16/2015   Intrapartum Procedures: Episiotomy: None [1]   Lacerations: Periurethral [8]  Patient had a delivery of a Viable infant. 05/16/2015  Information for the patient's newborn:  Zuhra, Lingley [id number]  Delivery Method: Vaginal, Vacuum (Extractor) (Filed from Delivery Summary) VE due to bradycardia in 2nd stage.  Pateint had an uncomplicated postpartum course. She is ambulating, tolerating a regular diet, passing flatus,  and urinating well. Patient is discharged home in stable condition on No discharge date for patient encounter.   Her history was remarkable for removal of cerclage on 05/10/15 under anesthesia by Dr. Alesia Richards, due to difficulty removing it in the office.    Physical exam  Filed Vitals:   05/16/15 1145 05/16/15 1545 05/16/15 2330 05/17/15 0704  BP: 142/75 142/88 124/64 144/82  Pulse: 57 57 85 63  Temp: 98 F (36.7 C) 98.5 F (36.9 C) 98.3 F (36.8 C) 98.1 F (36.7 C)  TempSrc: Oral Oral Oral Oral  Resp: 18 18 18 18   Height:      Weight:      SpO2:       General: alert Lochia: appropriate Uterine Fundus: firm Incision: Healing well with no significant drainage DVT Evaluation: No evidence of DVT seen on physical exam. Negative Homan's sign.  Labs: CBC Latest Ref Rng 05/17/2015 05/16/2015 05/10/2015  WBC 4.0 - 10.5 K/uL 11.6(H) 8.3 8.2  Hemoglobin 12.0 - 15.0 g/dL 7.7(L) 8.9(L) 8.5(L)  Hematocrit 36.0 - 46.0 % 24.7(L) 28.6(L) 27.1(L)  Platelets 150 - 400 K/uL 170 202 154    CMP Latest Ref Rng 05/16/2015  Glucose 65 - 99 mg/dL 89  BUN 6 - 20 mg/dL 5(L)  Creatinine 0.44 - 1.00 mg/dL 0.65  Sodium 135 - 145 mmol/L 137  Potassium 3.5 - 5.1 mmol/L 3.7  Chloride 101 - 111 mmol/L 107  CO2 22 - 32 mmol/L 21(L)  Calcium 8.9 - 10.3 mg/dL 8.9  Total Protein 6.5 - 8.1 g/dL 6.6  Total Bilirubin 0.3 - 1.2 mg/dL 0.1(L)  Alkaline Phos 38 - 126 U/L 513(H)  AST 15 - 41 U/L 43(H)  ALT 14 - 54 U/L 35    Discharge instruction: per After Visit Summary and "Baby and Me Booklet".  After visit meds:    Medication List    TAKE these medications       acetaminophen 500 MG tablet  Commonly known as: TYLENOL  Take 500 mg by  mouth every 6 (six) hours as needed.     albuterol 108 (90 BASE) MCG/ACT inhaler  Commonly known as: PROVENTIL HFA;VENTOLIN HFA   Inhale 1-2 puffs into the lungs every 6 (six) hours as needed for wheezing or shortness of breath.     ibuprofen 600 MG tablet  Commonly known as: ADVIL,MOTRIN  Take 1 tablet (600 mg total) by mouth every 6 (six) hours as needed.     oxyCODONE-acetaminophen 5-325 MG tablet  Commonly known as: PERCOCET/ROXICET  Take 1 tablet by mouth every 4 (four) hours as needed (for pain scale 4-7).     prenatal multivitamin Tabs tablet  Take 1 tablet by mouth daily at 12 noon.      Continue Fe BID as previously Rx'd.  Diet: routine diet  Activity: Advance as tolerated. Pelvic rest for 6 weeks.   Outpatient follow up:6 weeks Follow up Appt:No future appointments. Follow up Visit:No Follow-up on file.  Postpartum contraception: Progesterone only pills  Newborn Data: Live born female  Birth Weight: 5 lb 11.2 oz (2586 g) APGAR: 8, 9  Baby Feeding: Bottle and Breast Disposition:home with mother  Merrisa Skorupski CNM MSN 05/18/15 0800

## 2015-05-18 NOTE — Lactation Note (Signed)
This note was copied from the chart of [name]. Lactation Consultation Note  Patient Name: Shelly Bradley [id number] Date: 05/18/2015 Reason for consult: Follow-up assessment  Baby 49 hours. Mom states that she is putting baby to breast first, then supplementing with EBM/formula, then pumping if baby not nursing well. Mom states nursing is going well, she has a DEBP WIC from [facility], and an appointment with [facility]. Enc mom to continue nursing first, then supplementing and post-pumping. Mom aware of OP/BFSG and [place] phone line assistance after D/C.  Maternal Data    Feeding Feeding Type: Breast Fed Nipple Type: Slow - flow Length of feed: 30 min  LATCH Score/Interventions                      Lactation Tools Discussed/Used     Consult Status Consult Status: PRN    Shelly Bradley 05/18/2015, 8:40 AM

## 2016-02-21 IMAGING — DX DG CHEST 2V
2 series · 2 of 2 positions shown · non-contrast
Comparison: None.

CLINICAL DATA: Chronic cough, nausea and vomiting for 5 months

EXAM:
CHEST  2 VIEW

[chest pa]
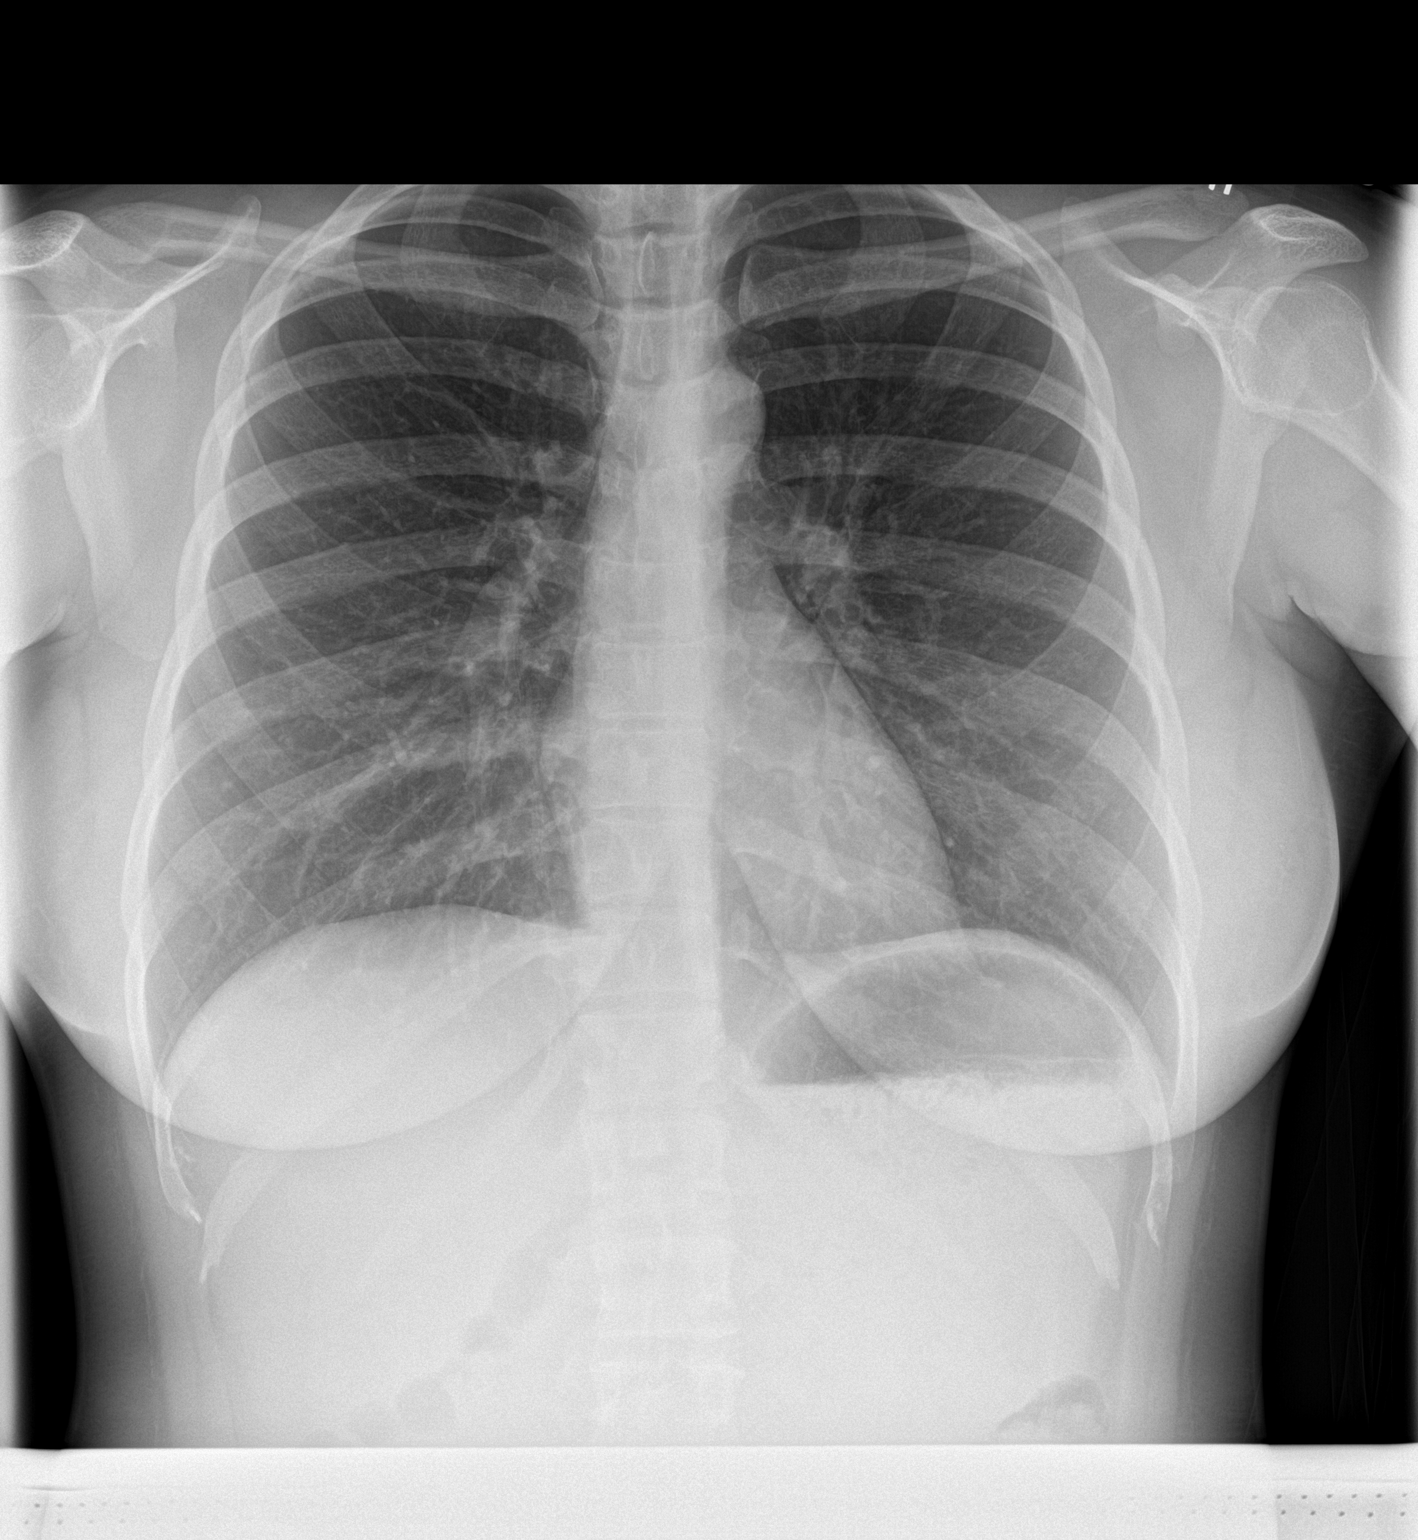

[chest lat]
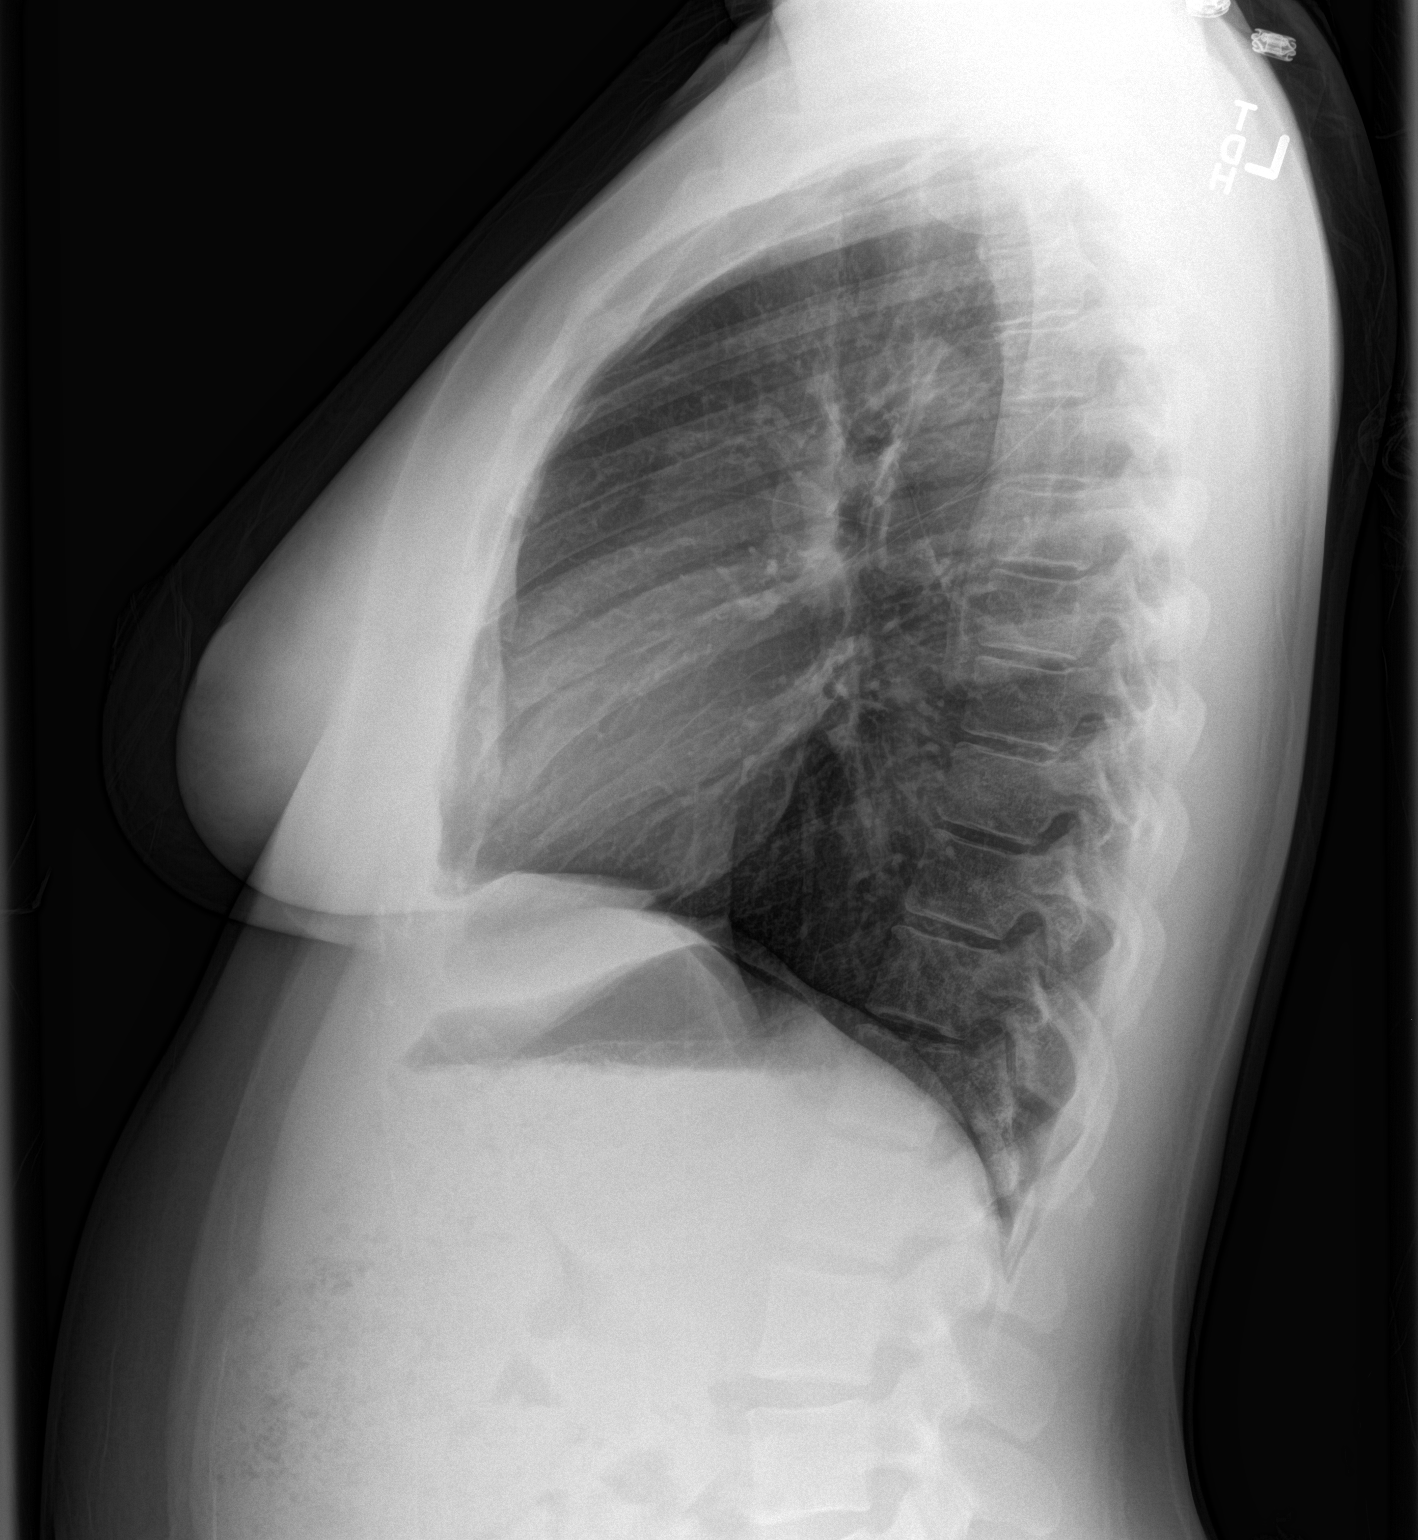

[2 of 2 positions shown; findings below may reference images not displayed]

FINDINGS: Cardiomediastinal silhouette is unremarkable. No acute infiltrate or
pleural effusion. No pulmonary edema. Bony thorax is unremarkable.
IMPRESSION: No active cardiopulmonary disease.

## 2016-06-18 IMAGING — US US OB COMP LESS 14 WK
1 series · 14 of 20 positions shown · non-contrast
Comparison: None.

CLINICAL DATA: Vaginal bleeding for several weeks.

EXAM:
OBSTETRIC <14 WK ULTRASOUND
TECHNIQUE: Transvaginal ultrasound was performed for complete evaluation of the
gestation as well as the maternal uterus, adnexal regions, and
pelvic cul-de-sac.

[Series 1: us ob comp less 14 wk · 14 of 20 slices shown]
[im 1/20]
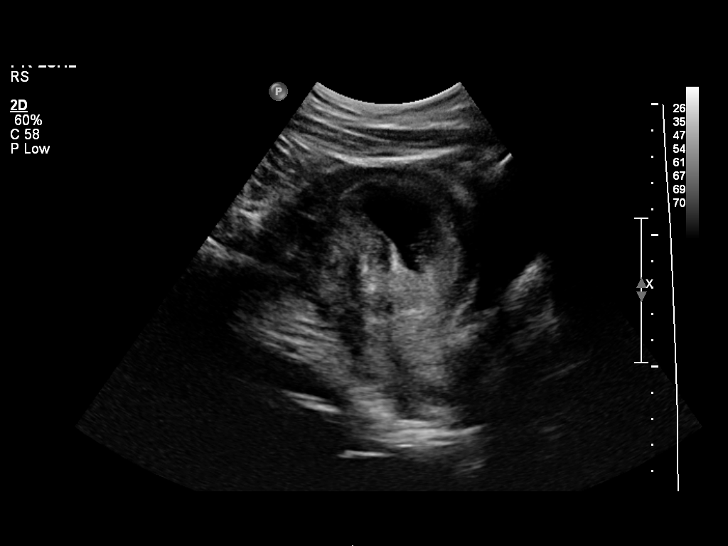
[im 3/20]
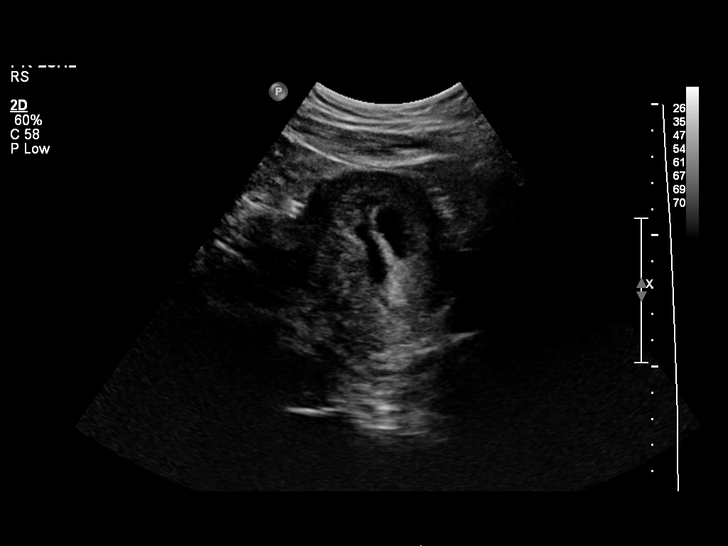
[im 4/20]
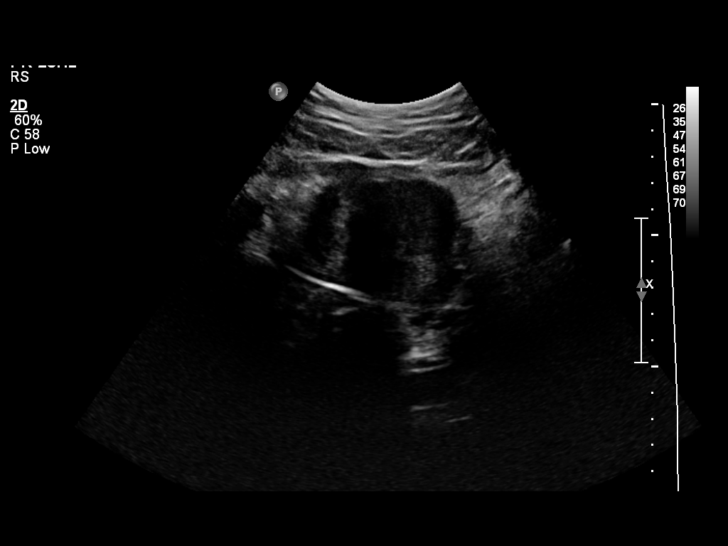
[im 6/20]
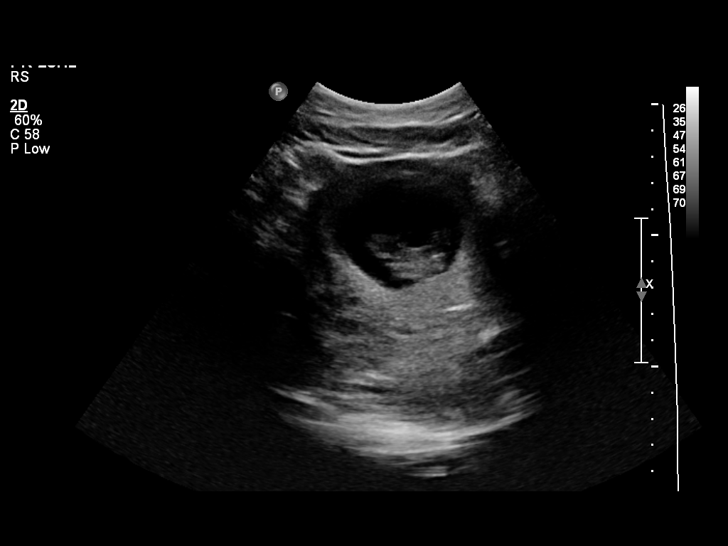
[im 7/20]
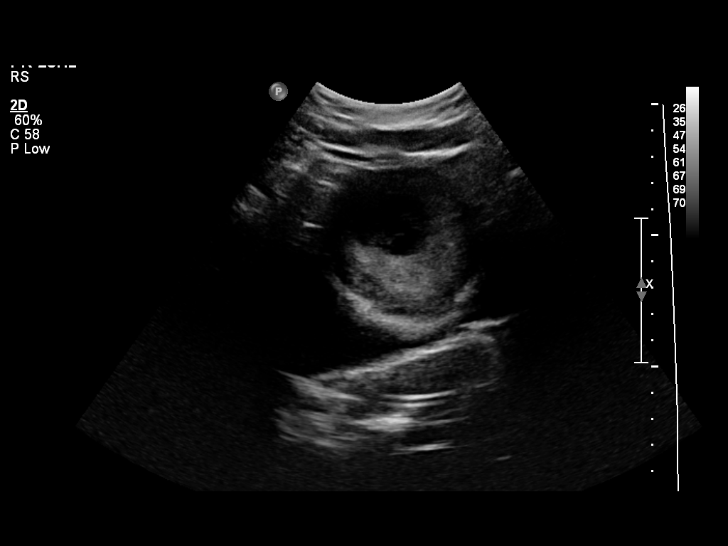
[im 8/20]
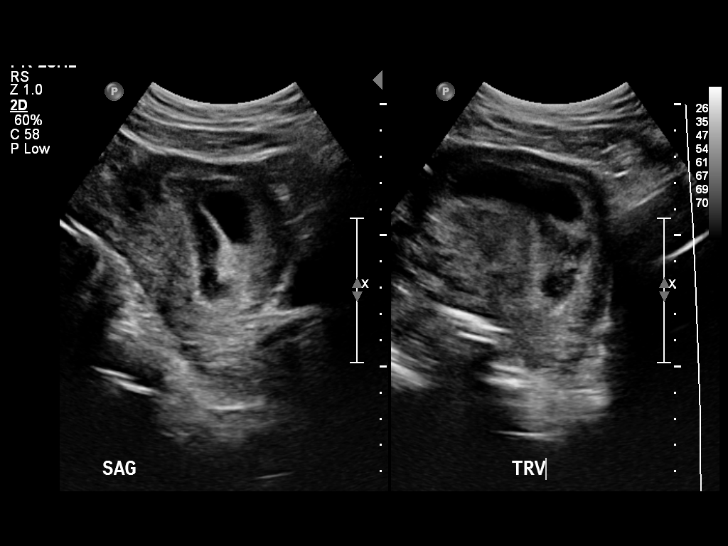
[im 10/20]
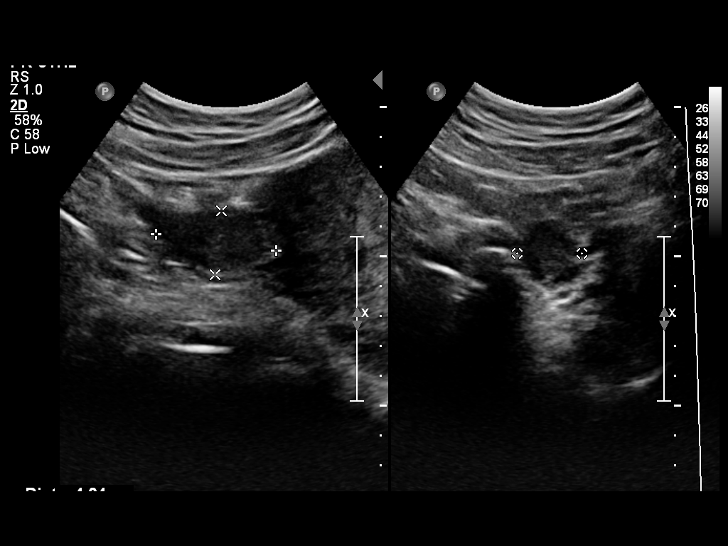
[im 11/20]
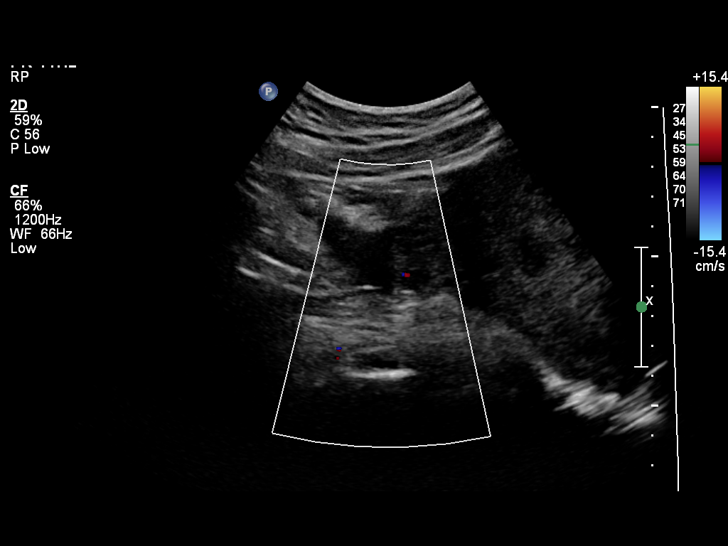
[im 13/20]
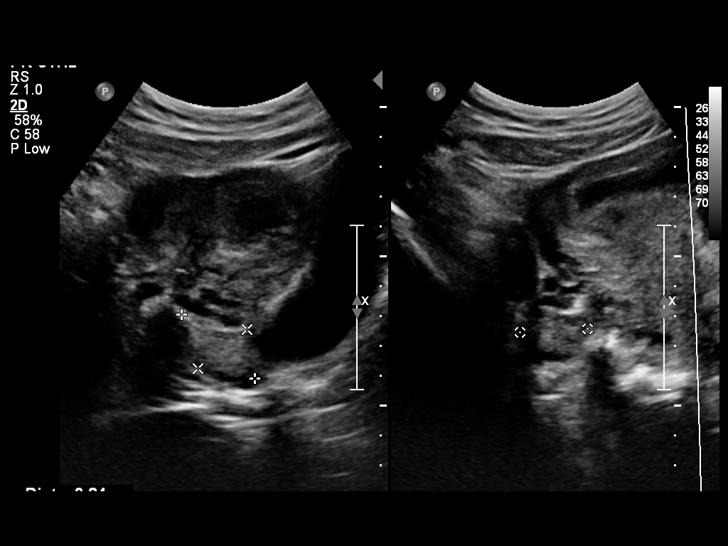
[im 14/20]
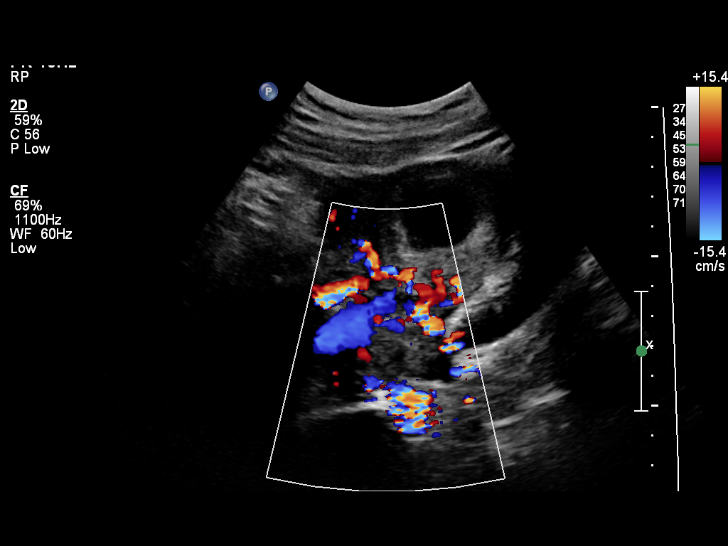
[im 16/20]
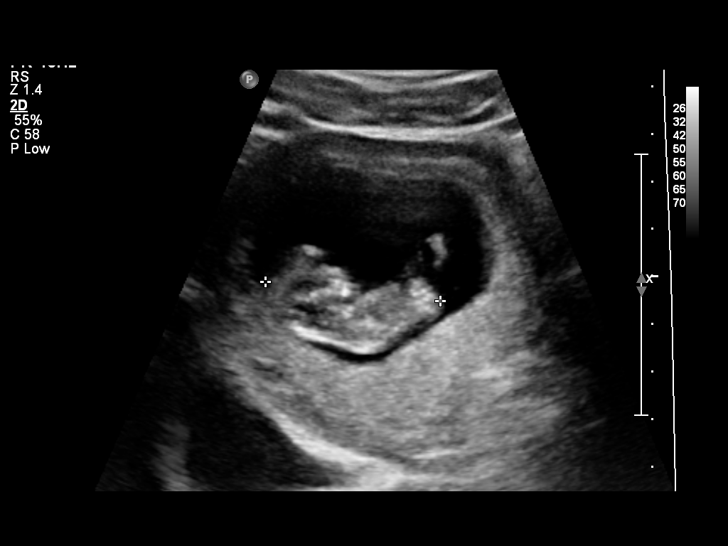
[im 17/20]
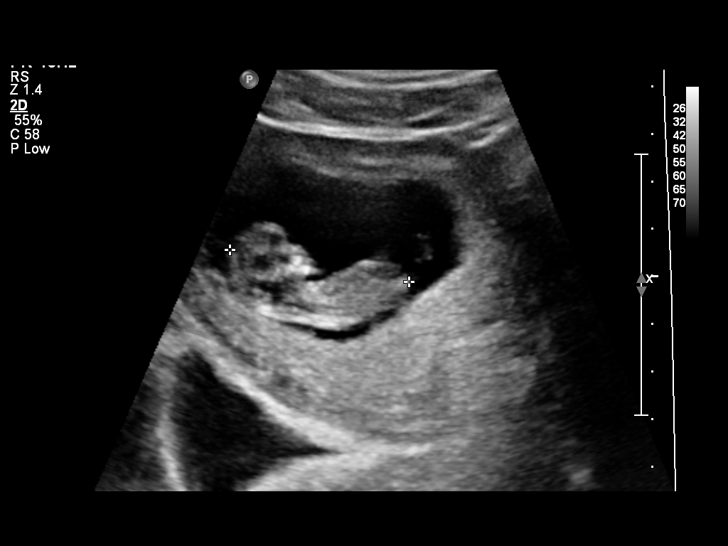
[im 18/20]
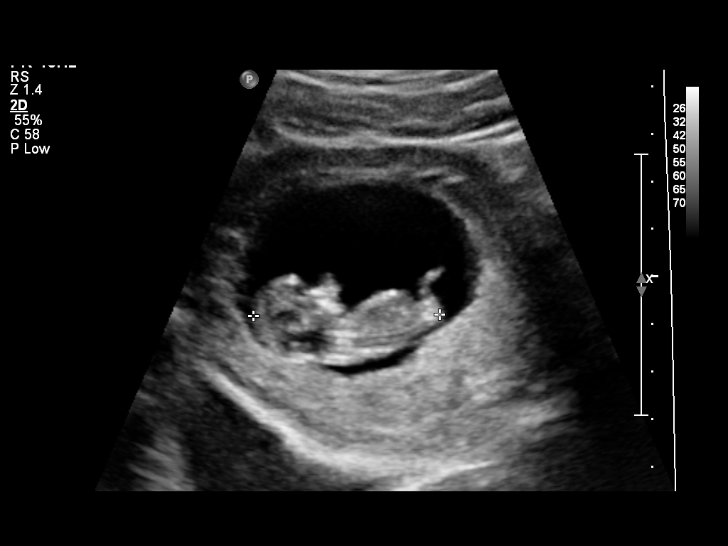
[im 20/20]
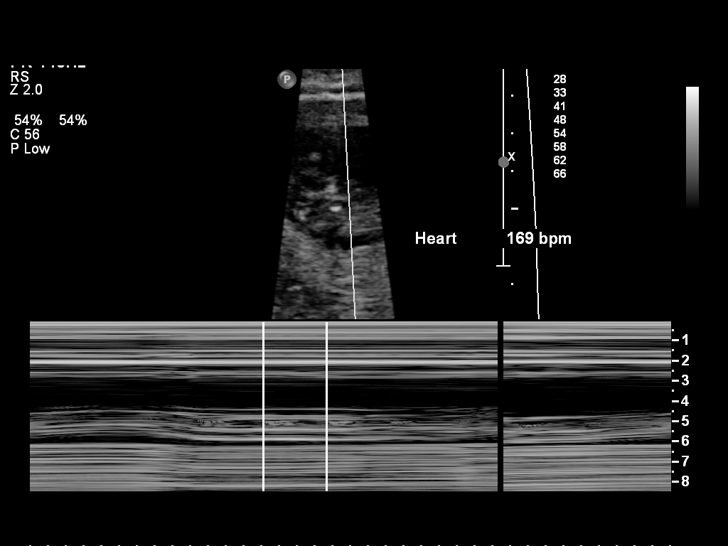

[14 of 20 positions shown; findings below may reference images not displayed]

FINDINGS: Intrauterine gestational sac: Single

Yolk sac:  Not identified

Embryo:  Present

Cardiac Activity: Present

Heart Rate: 169 bpm

CRL:   38  mm   10 w 5 d                  US EDC: 06/15/2015

Maternal uterus/adnexae: Small subchorionic hemorrhage. Normal
ovaries. No free fluid
IMPRESSION: 1. Single intrauterine gestation with embryo and normal cardiac
activity.

2. Estimated gestational age by crown rump length equals 10 weeks 5
days..

3.  Small subchorionic hemorrhage.

## 2016-09-02 ENCOUNTER — Emergency Department (HOSPITAL_COMMUNITY)
Admission: EM | Admit: 2016-09-02 | Discharge: 2016-09-02 | Disposition: A | Discharge status: Home / Self Care | Payer: Medicaid Other | Attending: Emergency Medicine | Admitting: Emergency Medicine

## 2016-09-02 ENCOUNTER — Encounter (HOSPITAL_COMMUNITY): Payer: Self-pay

## 2016-09-02 DIAGNOSIS — J45909 Unspecified asthma, uncomplicated: Secondary | ICD-10-CM | POA: Diagnosis not present

## 2016-09-02 DIAGNOSIS — K529 Noninfective gastroenteritis and colitis, unspecified: Secondary | ICD-10-CM

## 2016-09-02 DIAGNOSIS — R197 Diarrhea, unspecified: Secondary | ICD-10-CM | POA: Diagnosis present

## 2016-09-02 LAB — URINALYSIS, ROUTINE W REFLEX MICROSCOPIC
Bilirubin Urine: NEGATIVE
Glucose, UA: NEGATIVE mg/dL
Hgb urine dipstick: NEGATIVE
Ketones, ur: NEGATIVE mg/dL
LEUKOCYTES UA: NEGATIVE
NITRITE: NEGATIVE
PROTEIN: NEGATIVE mg/dL
Specific Gravity, Urine: 1.024 (ref 1.005–1.030)
pH: 5 (ref 5.0–8.0)

## 2016-09-02 LAB — COMPREHENSIVE METABOLIC PANEL
ALBUMIN: 4.1 g/dL (ref 3.5–5.0)
ALK PHOS: 50 U/L (ref 38–126)
ALT: 13 U/L — ABNORMAL LOW (ref 14–54)
ANION GAP: 10 (ref 5–15)
AST: 21 U/L (ref 15–41)
BILIRUBIN TOTAL: 0.4 mg/dL (ref 0.3–1.2)
BUN: 8 mg/dL (ref 6–20)
CALCIUM: 9.7 mg/dL (ref 8.9–10.3)
CO2: 24 mmol/L (ref 22–32)
Chloride: 103 mmol/L (ref 101–111)
Creatinine, Ser: 0.78 mg/dL (ref 0.44–1.00)
GFR calc Af Amer: >60 mL/min (ref >60)
GLUCOSE: 93 mg/dL (ref 65–99)
POTASSIUM: 3.5 mmol/L (ref 3.5–5.1)
Sodium: 137 mmol/L (ref 135–145)
TOTAL PROTEIN: 7.8 g/dL (ref 6.5–8.1)

## 2016-09-02 LAB — POC URINE PREG, ED: PREG TEST UR: NEGATIVE

## 2016-09-02 LAB — LIPASE, BLOOD: Lipase: 20 U/L (ref 11–51)

## 2016-09-02 LAB — CBC
HEMATOCRIT: 39.6 % (ref 36.0–46.0)
HEMOGLOBIN: 12.8 g/dL (ref 12.0–15.0)
MCH: 25.4 pg — ABNORMAL LOW (ref 26.0–34.0)
MCHC: 32.3 g/dL (ref 30.0–36.0)
MCV: 78.7 fL (ref 78.0–100.0)
Platelets: 200 10*3/uL (ref 150–400)
RBC: 5.03 MIL/uL (ref 3.87–5.11)
RDW: 14.6 % (ref 11.5–15.5)
WBC: 5.6 10*3/uL (ref 4.0–10.5)

## 2016-09-02 MED ORDER — DICYCLOMINE HCL 10 MG PO CAPS
10.0000 mg | ORAL_CAPSULE | Freq: Once | ORAL | Status: AC
  Administered 2016-09-02: 10 mg via ORAL
  Filled 2016-09-02: qty 1

## 2016-09-02 MED ORDER — ONDANSETRON 4 MG PO TBDP
8.0000 mg | ORAL_TABLET | Freq: Once | ORAL | Status: AC
  Administered 2016-09-02: 8 mg via ORAL
  Filled 2016-09-02: qty 2

## 2016-09-02 MED ORDER — ONDANSETRON 8 MG PO TBDP: ORAL_TABLET | ORAL | 0 refills | Status: DC

## 2016-09-02 NOTE — ED Provider Notes (Signed)
[facility] DEPT Provider Note   CSN: [id number] Arrival date & time: 09/02/16  6761  By signing my name below, I, Oleh Genin, attest that this documentation has been prepared under the direction and in the presence of Shera Laubach, MD. Electronically Signed: Oleh Genin, Scribe. 09/02/16. 1:28 AM.   History   Chief Complaint Chief Complaint  Patient presents with  . Cough  . Diarrhea    HPI Shelly Bradley is a 31 y.o. female with history of asthma who presents to the ED for evaluation of diarrhea. This patient states that for the last 24 hours she has experienced frequent watery diarrhea without frank blood as well as occasional vomiting. No abdominal pain. Denies sick contacts. She is also reporting a chronic dry cough that is unchanged. She is followed by primary care; scheduled to followup with ENT.  The history is provided by the patient. No language interpreter was used.  Emesis   This is a new problem. The current episode started yesterday. The problem occurs 2 to 4 times per day. The problem has not changed since onset.The emesis has an appearance of stomach contents. There has been no fever. Associated symptoms include diarrhea. Pertinent negatives include no abdominal pain, no arthralgias, no chills and no fever. Risk factors include ill contacts.    Past Medical History:  Diagnosis Date  . Abnormal pap 2004  . Asthma   . Fibroid     Patient Active Problem List   Diagnosis Date Noted  . Status post vacuum-assisted vaginal delivery 05/16/2015  . Marginal insertion of umbilical cord 95/01/3266  . Incompetent cervix--cerclage 05/10/2015  . Pregnancy complicated by previous recurrent miscarriages 05/10/2015  . H/O LEEP 05/10/2015  . Velamentous insertion of umbilical cord 12/45/8099  . Anemia--Hgb 9.3 at 28 weeks. 05/10/2015  . Pregnancy 05/10/2015  . Premature delivery before 37 weeks 04/05/2014  . Fibroids 05/05/2012    Past Surgical History:    Procedure Laterality Date  . CERVICAL BIOPSY  W/ LOOP ELECTRODE EXCISION  2004  . CERVICAL CERCLAGE N/A 02/10/2015   Procedure: CERCLAGE CERVICAL;  Surgeon: Waymon Amato, MD;  Location: [facility];  Service: Gynecology;  Laterality: N/A;  . CERVICAL CERCLAGE N/A 05/10/2015   Procedure: REMOVAL OF CERCLAGE CERVICAL;  Surgeon: Waymon Amato, MD;  Location: [facility];  Service: Gynecology;  Laterality: N/A;    OB History    Gravida Para Term Preterm AB Living   4 2 1 1 2 2    SAB TAB Ectopic Multiple Live Births   2 0 0 0 2       Home Medications    Prior to Admission medications   Medication Sig Start Date End Date Taking? Authorizing Provider  albuterol (PROVENTIL HFA;VENTOLIN HFA) 108 (90 BASE) MCG/ACT inhaler Inhale 1-2 puffs into the lungs every 6 (six) hours as needed for wheezing or shortness of breath. Patient not taking: Reported on 09/02/2016 07/17/14   Evelina Bucy, MD  ibuprofen (ADVIL,MOTRIN) 600 MG tablet Take 1 tablet (600 mg total) by mouth every 6 (six) hours as needed. Patient not taking: Reported on 09/02/2016 05/17/15   Donnel Saxon, CNM  norethindrone (ORTHO MICRONOR) 0.35 MG tablet Take 1 tablet (0.35 mg total) by mouth daily. Patient not taking: Reported on 09/02/2016 05/17/15   Donnel Saxon, CNM  oxyCODONE-acetaminophen (PERCOCET/ROXICET) 5-325 MG tablet Take 1 tablet by mouth every 4 (four) hours as needed (for pain scale 4-7). Patient not taking: Reported on 09/02/2016 05/17/15   Donnel Saxon, CNM    Family  History Family History  Problem Relation Age of Onset  . Diabetes Maternal Aunt   . Diabetes Maternal Grandmother     Social History Social History  Substance Use Topics  . Smoking status: Never Smoker  . Smokeless tobacco: Never Used  . Alcohol use Yes     Comment: Social Use before preg     Allergies   Patient has no known allergies.   Review of Systems Review of Systems  Constitutional: Negative for chills and fever.  HENT: Negative for ear pain and sore  throat.   Eyes: Negative for pain and visual disturbance.  Respiratory: Negative for shortness of breath.   Cardiovascular: Negative for chest pain, palpitations and leg swelling.  Gastrointestinal: Positive for diarrhea, nausea and vomiting. Negative for abdominal pain.  Genitourinary: Negative for dysuria and hematuria.  Musculoskeletal: Negative for arthralgias and back pain.  Skin: Negative for color change and rash.  Neurological: Negative for seizures and syncope.  All other systems reviewed and are negative.    Physical Exam Updated Vital Signs BP (!) 127/97   Pulse 80   Temp 98.1 F (36.7 C) (Oral)   Resp 16   LMP 08/05/2016 (Approximate)   SpO2 100%   Physical Exam  Constitutional: She appears well-developed and well-nourished. No distress.  HENT:  Head: Normocephalic and atraumatic.  Mouth/Throat: Oropharynx is clear and moist. No oropharyngeal exudate.  There is clear post-nasal drip.   Eyes: Conjunctivae are normal. Pupils are equal, round, and reactive to light.  Neck: Normal range of motion. Neck supple. No JVD present. Carotid bruit is not present.  Cardiovascular: Normal rate and regular rhythm.   No murmur heard. Pulmonary/Chest: Effort normal and breath sounds normal. No stridor. No respiratory distress.  Abdominal: Soft. She exhibits no mass. There is no tenderness. There is no rebound and no guarding.  Musculoskeletal: Normal range of motion. She exhibits no edema.  Lymphadenopathy:    She has no cervical adenopathy.  Neurological: She is alert. She displays normal reflexes.  Skin: Skin is warm and dry. Capillary refill takes less than 2 seconds.  Psychiatric: She has a normal mood and affect.  Nursing note and vitals reviewed.    ED Treatments / Results   Vitals:   09/02/16 0037  BP: (!) 127/97  Pulse: 80  Resp: 16  Temp: 98.1 F (36.7 C)    Labs (all labs ordered are listed, but only abnormal results are displayed)  Results for orders  placed or performed during the hospital encounter of 09/02/16  Lipase, blood  Result Value Ref Range   Lipase 20 11 - 51 U/L  Comprehensive metabolic panel  Result Value Ref Range   Sodium 137 135 - 145 mmol/L   Potassium 3.5 3.5 - 5.1 mmol/L   Chloride 103 101 - 111 mmol/L   CO2 24 22 - 32 mmol/L   Glucose, Bld 93 65 - 99 mg/dL   BUN 8 6 - 20 mg/dL   Creatinine, Ser 0.78 0.44 - 1.00 mg/dL   Calcium 9.7 8.9 - 10.3 mg/dL   Total Protein 7.8 6.5 - 8.1 g/dL   Albumin 4.1 3.5 - 5.0 g/dL   AST 21 15 - 41 U/L   ALT 13 (L) 14 - 54 U/L   Alkaline Phosphatase 50 38 - 126 U/L   Total Bilirubin 0.4 0.3 - 1.2 mg/dL   GFR calc non Af Amer >60 >60 mL/min   GFR calc Af Amer >60 >60 mL/min   Anion gap 10  5 - 15  CBC  Result Value Ref Range   WBC 5.6 4.0 - 10.5 K/uL   RBC 5.03 3.87 - 5.11 MIL/uL   Hemoglobin 12.8 12.0 - 15.0 g/dL   HCT 39.6 36.0 - 46.0 %   MCV 78.7 78.0 - 100.0 fL   MCH 25.4 (L) 26.0 - 34.0 pg   MCHC 32.3 30.0 - 36.0 g/dL   RDW 14.6 11.5 - 15.5 %   Platelets 200 150 - 400 K/uL  Urinalysis, Routine w reflex microscopic  Result Value Ref Range   Color, Urine YELLOW YELLOW   APPearance HAZY (A) CLEAR   Specific Gravity, Urine 1.024 1.005 - 1.030   pH 5.0 5.0 - 8.0   Glucose, UA NEGATIVE NEGATIVE mg/dL   Hgb urine dipstick NEGATIVE NEGATIVE   Bilirubin Urine NEGATIVE NEGATIVE   Ketones, ur NEGATIVE NEGATIVE mg/dL   Protein, ur NEGATIVE NEGATIVE mg/dL   Nitrite NEGATIVE NEGATIVE   Leukocytes, UA NEGATIVE NEGATIVE  POC urine preg, ED  Result Value Ref Range   Preg Test, Ur NEGATIVE NEGATIVE   No results found.  Radiology No results found.  Procedures Procedures (including critical care time)  Medications Ordered in ED  Medications  ondansetron (ZOFRAN-ODT) disintegrating tablet 8 mg (8 mg Oral Given 09/02/16 0134)  dicyclomine (BENTYL) capsule 10 mg (10 mg Oral Given 09/02/16 0134)   Cough is been going on for years and lungs are clear this is chronic and does  not require chest XRay at this time, follow up with your PMD and ENT as already scheduled for same.       This is a 31 y.o. -year-old female presents with diarrhea and vomiting consistent with viral ggastroenteritis.  The patient is nontoxic-appearing on exam and vital signs are within normal limits.  Po challenged successfully in the department.  Return for fevers, shortness of breath, chest pain, productive cough, leg pain or swelling, intractable pain, intractable vomiting, inability to pass stool, bleedingor any concerns. Start eating a very bland diet.  No greasy or spicy foods for the next several days and have good hand hygeine in the home.   After history, exam, and medical workup I feel the patient has been appropriately medically screened and is safe for discharge home. Pertinent diagnoses were discussed with the patient. Patient was given return precautions.   I personally performed the services described in this documentation, which was scribed in my presence. The recorded information has been reviewed and is accurate.      Veatrice Kells, MD 09/02/16 [phone]

## 2016-09-02 NOTE — ED Triage Notes (Signed)
Pt complaining of cough and diarrhea x 2 weeks. Pt states 7 episodes of diarrhea today. Pt denies any productive cough.

## 2017-03-20 ENCOUNTER — Emergency Department (HOSPITAL_COMMUNITY)
Admission: EM | Admit: 2017-03-20 | Discharge: 2017-03-20 | Disposition: A | Discharge status: Home / Self Care | Payer: No Typology Code available for payment source | Attending: Emergency Medicine | Admitting: Emergency Medicine

## 2017-03-20 ENCOUNTER — Encounter (HOSPITAL_COMMUNITY): Payer: Self-pay | Admitting: Emergency Medicine

## 2017-03-20 DIAGNOSIS — S39012A Strain of muscle, fascia and tendon of lower back, initial encounter: Secondary | ICD-10-CM | POA: Diagnosis not present

## 2017-03-20 DIAGNOSIS — Y92410 Unspecified street and highway as the place of occurrence of the external cause: Secondary | ICD-10-CM | POA: Diagnosis not present

## 2017-03-20 DIAGNOSIS — Y9389 Activity, other specified: Secondary | ICD-10-CM | POA: Diagnosis not present

## 2017-03-20 DIAGNOSIS — J45909 Unspecified asthma, uncomplicated: Secondary | ICD-10-CM | POA: Diagnosis not present

## 2017-03-20 DIAGNOSIS — S3992XA Unspecified injury of lower back, initial encounter: Secondary | ICD-10-CM | POA: Diagnosis present

## 2017-03-20 DIAGNOSIS — Y999 Unspecified external cause status: Secondary | ICD-10-CM | POA: Diagnosis not present

## 2017-03-20 MED ORDER — IBUPROFEN 600 MG PO TABS: 600.0000 mg | ORAL_TABLET | Freq: Four times a day (QID) | ORAL | 0 refills | Status: DC | PRN

## 2017-03-20 MED ORDER — IBUPROFEN 400 MG PO TABS
800.0000 mg | ORAL_TABLET | Freq: Once | ORAL | Status: AC
  Administered 2017-03-20: 800 mg via ORAL
  Filled 2017-03-20: qty 2

## 2017-03-20 MED ORDER — CYCLOBENZAPRINE HCL 5 MG PO TABS: 5.0000 mg | ORAL_TABLET | Freq: Three times a day (TID) | ORAL | 0 refills | Status: DC | PRN
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
## 2017-03-20 NOTE — Discharge Instructions (Signed)
Take motrin for pain.   Take flexeril for muscle spasms.   Expect to be stiff and sore for 2-3 days.   See your doctor  Return to ER if you have worse back pain, flank pain, chest pain, vomiting.

## 2017-03-20 NOTE — ED Notes (Signed)
ED Provider at bedside. 
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
## 2017-06-21 ENCOUNTER — Encounter (HOSPITAL_COMMUNITY): Payer: Self-pay | Admitting: Emergency Medicine

## 2017-06-21 ENCOUNTER — Other Ambulatory Visit: Payer: Self-pay

## 2017-06-21 ENCOUNTER — Ambulatory Visit (HOSPITAL_COMMUNITY)
Admission: EM | Admit: 2017-06-21 | Discharge: 2017-06-21 | Disposition: A | Discharge status: Home / Self Care | Payer: Medicaid Other | Attending: Family Medicine | Admitting: Family Medicine

## 2017-06-21 DIAGNOSIS — L0291 Cutaneous abscess, unspecified: Secondary | ICD-10-CM | POA: Diagnosis not present

## 2017-06-21 MED ORDER — DOXYCYCLINE HYCLATE 100 MG PO TABS: 100.0000 mg | ORAL_TABLET | Freq: Two times a day (BID) | ORAL | 0 refills | Status: AC

## 2017-06-21 NOTE — ED Triage Notes (Signed)
Pt c/o boil/abscess on R side of face. States its been there for a few years but it got bigger this week.

## 2017-06-21 NOTE — Discharge Instructions (Signed)
Continue to apply warm moist compresses to the abscess areas.  Return for continued swelling

## 2017-06-21 NOTE — ED Provider Notes (Addendum)
[place]   [id number] 06/21/17 Arrival Time: 1912   SUBJECTIVE:  Shelly Bradley is a 32 y.o. female who presents to the urgent care with complaint of boil/abscess on R side of face. States its been there for a few years but it got bigger this week.   Past Medical History:  Diagnosis Date  . Abnormal pap 2004  . Asthma   . Fibroid    Family History  Problem Relation Age of Onset  . Diabetes Maternal Aunt   . Diabetes Maternal Grandmother    Social History   Socioeconomic History  . Marital status: Single    Spouse name: Not on file  . Number of children: Not on file  . Years of education: Not on file  . Highest education level: Not on file  Social Needs  . Financial resource strain: Not on file  . Food insecurity - worry: Not on file  . Food insecurity - inability: Not on file  . Transportation needs - medical: Not on file  . Transportation needs - non-medical: Not on file  Occupational History  . Not on file  Tobacco Use  . Smoking status: Never Smoker  . Smokeless tobacco: Never Used  Substance and Sexual Activity  . Alcohol use: Yes    Comment: Social Use before preg  . Drug use: No  . Sexual activity: Not Currently  Other Topics Concern  . Not on file  Social History Narrative  . Not on file   No outpatient medications have been marked as taking for the 06/21/17 encounter [facility] Encounter).   No Known Allergies    ROS: As per HPI, remainder of ROS negative.   OBJECTIVE:   Vitals:   06/21/17 1924  BP: 128/88  Pulse: 61  Resp: 18  Temp: 97.9 F (36.6 C)  SpO2: 100%     General appearance: alert; no distress Eyes: PERRL; EOMI; conjunctiva normal HENT: normocephalic; atraumatic; TMs normal, canal normal, external ears normal without trauma; nasal mucosa normal; oral mucosa normal Neck: supple Lungs: clear to auscultation bilaterally Heart: regular rate and rhythm Abdomen: soft, non-tender; bowel sounds normal; no masses or  organomegaly; no guarding or rebound tenderness Back: no CVA tenderness Extremities: no cyanosis or edema; symmetrical with no gross deformities Skin: warm and dry Neurologic: normal gait; grossly normal Psychological: alert and cooperative; normal mood and affect      Labs:  Results for orders placed or performed during the hospital encounter of 09/02/16  Lipase, blood  Result Value Ref Range   Lipase 20 11 - 51 U/L  Comprehensive metabolic panel  Result Value Ref Range   Sodium 137 135 - 145 mmol/L   Potassium 3.5 3.5 - 5.1 mmol/L   Chloride 103 101 - 111 mmol/L   CO2 24 22 - 32 mmol/L   Glucose, Bld 93 65 - 99 mg/dL   BUN 8 6 - 20 mg/dL   Creatinine, Ser 0.78 0.44 - 1.00 mg/dL   Calcium 9.7 8.9 - 10.3 mg/dL   Total Protein 7.8 6.5 - 8.1 g/dL   Albumin 4.1 3.5 - 5.0 g/dL   AST 21 15 - 41 U/L   ALT 13 (L) 14 - 54 U/L   Alkaline Phosphatase 50 38 - 126 U/L   Total Bilirubin 0.4 0.3 - 1.2 mg/dL   GFR calc non Af Amer >60 >60 mL/min   GFR calc Af Amer >60 >60 mL/min   Anion gap 10 5 - 15  CBC  Result  Value Ref Range   WBC 5.6 4.0 - 10.5 K/uL   RBC 5.03 3.87 - 5.11 MIL/uL   Hemoglobin 12.8 12.0 - 15.0 g/dL   HCT 39.6 36.0 - 46.0 %   MCV 78.7 78.0 - 100.0 fL   MCH 25.4 (L) 26.0 - 34.0 pg   MCHC 32.3 30.0 - 36.0 g/dL   RDW 14.6 11.5 - 15.5 %   Platelets 200 150 - 400 K/uL  Urinalysis, Routine w reflex microscopic  Result Value Ref Range   Color, Urine YELLOW YELLOW   APPearance HAZY (A) CLEAR   Specific Gravity, Urine 1.024 1.005 - 1.030   pH 5.0 5.0 - 8.0   Glucose, UA NEGATIVE NEGATIVE mg/dL   Hgb urine dipstick NEGATIVE NEGATIVE   Bilirubin Urine NEGATIVE NEGATIVE   Ketones, ur NEGATIVE NEGATIVE mg/dL   Protein, ur NEGATIVE NEGATIVE mg/dL   Nitrite NEGATIVE NEGATIVE   Leukocytes, UA NEGATIVE NEGATIVE  POC urine preg, ED  Result Value Ref Range   Preg Test, Ur NEGATIVE NEGATIVE    Labs Reviewed - No data to display  No results found.     ASSESSMENT  & PLAN:  1. Abscess     Meds ordered this encounter  Medications  . doxycycline (VIBRA-TABS) 100 MG tablet    Sig: Take 1 tablet (100 mg total) by mouth 2 (two) times daily.    Dispense:  20 tablet    Refill:  0    Reviewed expectations re: course of current medical issues. Questions answered. Outlined signs and symptoms indicating need for more acute intervention. Patient verbalized understanding. After Visit Summary given.    Procedures:  Sebaceous abscess right cheek I&D'd after xylocaine with epi infiltrated.      Robyn Haber, MD 06/21/17 Charm Rings, MD 06/21/17 2002
